# Patient Record
Sex: Male | Born: 1990 | Race: White | Hispanic: No | Marital: Single | Smoking: Never smoker
Health system: Southern US, Community
[De-identification: ages and names within clinical notes are randomized; demographics above are authoritative.]

## PROBLEM LIST (undated history)

## (undated) DIAGNOSIS — M6282 Rhabdomyolysis: Secondary | ICD-10-CM

## (undated) DIAGNOSIS — T7840XA Allergy, unspecified, initial encounter: Secondary | ICD-10-CM

## (undated) HISTORY — DX: Allergy, unspecified, initial encounter: T78.40XA

## (undated) HISTORY — DX: Rhabdomyolysis: M62.82

## (undated) HISTORY — PX: NO PAST SURGERIES: SHX2092

---

## 2014-06-07 ENCOUNTER — Inpatient Hospital Stay (HOSPITAL_COMMUNITY)
Admission: EM | Admit: 2014-06-07 | Discharge: 2014-06-10 | DRG: 558 | Disposition: A | Discharge status: Home / Self Care | Payer: 59 | Attending: Internal Medicine | Admitting: Internal Medicine

## 2014-06-07 ENCOUNTER — Encounter (HOSPITAL_COMMUNITY): Payer: Self-pay | Admitting: Cardiology

## 2014-06-07 ENCOUNTER — Emergency Department (HOSPITAL_COMMUNITY): Payer: 59

## 2014-06-07 DIAGNOSIS — Z888 Allergy status to other drugs, medicaments and biological substances status: Secondary | ICD-10-CM

## 2014-06-07 DIAGNOSIS — M791 Myalgia: Secondary | ICD-10-CM | POA: Diagnosis not present

## 2014-06-07 DIAGNOSIS — Q447 Other congenital malformations of liver: Secondary | ICD-10-CM

## 2014-06-07 DIAGNOSIS — M6282 Rhabdomyolysis: Principal | ICD-10-CM | POA: Diagnosis present

## 2014-06-07 DIAGNOSIS — T796XXA Traumatic ischemia of muscle, initial encounter: Secondary | ICD-10-CM

## 2014-06-07 HISTORY — DX: Rhabdomyolysis: M62.82

## 2014-06-07 LAB — BASIC METABOLIC PANEL
ANION GAP: 10 (ref 5–15)
BUN: 9 mg/dL (ref 6–23)
CALCIUM: 9.2 mg/dL (ref 8.4–10.5)
CO2: 25 mmol/L (ref 19–32)
Chloride: 105 mEq/L (ref 96–112)
Creatinine, Ser: 0.95 mg/dL (ref 0.50–1.35)
GFR calc Af Amer: >90 mL/min (ref >90)
GFR calc non Af Amer: >90 mL/min (ref >90)
GLUCOSE: 101 mg/dL — AB (ref 70–99)
Potassium: 4.6 mmol/L (ref 3.5–5.1)
Sodium: 140 mmol/L (ref 135–145)

## 2014-06-07 LAB — CBC WITH DIFFERENTIAL/PLATELET
BASOS PCT: 0 % (ref 0–1)
Basophils Absolute: 0 10*3/uL (ref 0.0–0.1)
EOS ABS: 0 10*3/uL (ref 0.0–0.7)
EOS PCT: 1 % (ref 0–5)
HCT: 43.7 % (ref 39.0–52.0)
Hemoglobin: 15.1 g/dL (ref 13.0–17.0)
Lymphocytes Relative: 12 % (ref 12–46)
Lymphs Abs: 1 10*3/uL (ref 0.7–4.0)
MCH: 30.6 pg (ref 26.0–34.0)
MCHC: 34.6 g/dL (ref 30.0–36.0)
MCV: 88.6 fL (ref 78.0–100.0)
Monocytes Absolute: 0.7 10*3/uL (ref 0.1–1.0)
Monocytes Relative: 8 % (ref 3–12)
NEUTROS PCT: 79 % — AB (ref 43–77)
Neutro Abs: 6.6 10*3/uL (ref 1.7–7.7)
Platelets: 296 10*3/uL (ref 150–400)
RBC: 4.93 MIL/uL (ref 4.22–5.81)
RDW: 12.1 % (ref 11.5–15.5)
WBC: 8.3 10*3/uL (ref 4.0–10.5)

## 2014-06-07 LAB — CK TOTAL AND CKMB (NOT AT ARMC)
CK, MB: 27.3 ng/mL — AB (ref 0.3–4.0)
Total CK: >50000 U/L — ABNORMAL HIGH (ref 7–232)

## 2014-06-07 LAB — URINALYSIS, ROUTINE W REFLEX MICROSCOPIC
GLUCOSE, UA: NEGATIVE mg/dL
Ketones, ur: 15 mg/dL — AB
Nitrite: NEGATIVE
Protein, ur: 100 mg/dL — AB
Specific Gravity, Urine: 1.027 (ref 1.005–1.030)
UROBILINOGEN UA: 0.2 mg/dL (ref 0.0–1.0)
pH: 5.5 (ref 5.0–8.0)

## 2014-06-07 LAB — URINE MICROSCOPIC-ADD ON

## 2014-06-07 LAB — TROPONIN I: Troponin I: <0.03 ng/mL (ref <0.031)

## 2014-06-07 MED ORDER — SODIUM CHLORIDE 0.9 % IV BOLUS (SEPSIS)
1000.0000 mL | Freq: Once | INTRAVENOUS | Status: AC
  Administered 2014-06-07: 1000 mL via INTRAVENOUS

## 2014-06-07 MED ORDER — OXYCODONE HCL 5 MG PO TABS: 5.0000 mg | ORAL_TABLET | ORAL | Status: DC | PRN

## 2014-06-07 MED ORDER — INFLUENZA VAC SPLIT QUAD 0.5 ML IM SUSY
0.5000 mL | PREFILLED_SYRINGE | INTRAMUSCULAR | Status: AC
  Administered 2014-06-08: 0.5 mL via INTRAMUSCULAR
  Filled 2014-06-07: qty 0.5

## 2014-06-07 MED ORDER — ENOXAPARIN SODIUM 40 MG/0.4ML ~~LOC~~ SOLN
40.0000 mg | SUBCUTANEOUS | Status: DC
  Administered 2014-06-07 – 2014-06-09 (×3): 40 mg via SUBCUTANEOUS
  Filled 2014-06-07 (×4): qty 0.4

## 2014-06-07 MED ORDER — SODIUM CHLORIDE 0.9 % IV SOLN
INTRAVENOUS | Status: DC
  Administered 2014-06-07 – 2014-06-08 (×3): via INTRAVENOUS
  Administered 2014-06-08 (×2): 300 mL via INTRAVENOUS
  Administered 2014-06-09 (×5): via INTRAVENOUS
  Administered 2014-06-09: 300 mL via INTRAVENOUS
  Administered 2014-06-10 (×3): via INTRAVENOUS

## 2014-06-07 NOTE — ED Provider Notes (Signed)
CSN: [id number]     Arrival date & time 06/07/14  1051 History   First MD Initiated Contact with Patient 06/07/14 1139     Chief Complaint  Patient presents with  . Dehydration  . Chest Pain     The history is provided by the patient.   Mr. Belia HemanHennessy presents for evaluation of right-sided chest pain. Four days ago he did a vigorous upper body workout with presses. He had typical soreness on his right chest wall the next day. His pain has become progressively worse to the point that he can barely move his chest and right arm. Pain is worse with range of motion. If he stays perfectly still he feels just tight. He denies fevers, shortness of breath, cough, leg swelling or pain. He has no medical problems and extremity medications. He reports his urine is dark, it looks like apple juice.  He was seen at urgent care today and referred to the ED for further eval.  Sxs are moderate, constant, worsening.    History reviewed. No pertinent past medical history. History reviewed. No pertinent past surgical history. History reviewed. No pertinent family history. History  Substance Use Topics  . Smoking status: Never Smoker   . Smokeless tobacco: Not on file  . Alcohol Use: Yes    Review of Systems  All other systems reviewed and are negative.     Allergies  Meningococcal vaccines  Home Medications   Prior to Admission medications   Not on File   BP 131/81 mmHg  Pulse 78  Temp(Src) 98 F (36.7 C) (Oral)  Resp 15  SpO2 95% Physical Exam  Constitutional: He is oriented to person, place, and time. He appears well-developed and well-nourished.  HENT:  Head: Normocephalic and atraumatic.  Cardiovascular: Normal rate and regular rhythm.   No murmur heard. Pulmonary/Chest: Effort normal and breath sounds normal. No respiratory distress. He exhibits tenderness.  Abdominal: Soft. There is no tenderness. There is no rebound and no guarding.  Musculoskeletal: He exhibits no edema or  tenderness.  Neurological: He is alert and oriented to person, place, and time.  Skin: Skin is warm and dry.  Psychiatric: He has a normal mood and affect. His behavior is normal.  Nursing note and vitals reviewed.   ED Course  Procedures (including critical care time) Labs Review Labs Reviewed  BASIC METABOLIC PANEL - Abnormal; Notable for the following:    Glucose, Bld 101 (*)    All other components within normal limits  CBC WITH DIFFERENTIAL - Abnormal; Notable for the following:    Neutrophils Relative % 79 (*)    All other components within normal limits  CK TOTAL AND CKMB - Abnormal; Notable for the following:    Total CK >50000 (*)    CK, MB 27.3 (*)    All other components within normal limits  URINALYSIS, ROUTINE W REFLEX MICROSCOPIC - Abnormal; Notable for the following:    Color, Urine AMBER (*)    APPearance CLOUDY (*)    Hgb urine dipstick LARGE (*)    Bilirubin Urine SMALL (*)    Ketones, ur 15 (*)    Protein, ur 100 (*)    Leukocytes, UA TRACE (*)    All other components within normal limits  TROPONIN I  URINE MICROSCOPIC-ADD ON    Imaging Review Dg Chest 2 View  06/07/2014   CLINICAL DATA:  Initial encounter for left upper chest pain and burning for 2 days. Nonsmoker.  EXAM: CHEST  2  VIEW  COMPARISON:  None.  FINDINGS: Midline trachea.  Normal heart size and mediastinal contours.  Sharp costophrenic angles.  No pneumothorax.  Clear lungs.  IMPRESSION: No active cardiopulmonary disease.   Electronically Signed   By: Jeronimo Greaves M.D.   On: 06/07/2014 13:31     EKG Interpretation   Date/Time:  Saturday June 07 2014 10:57:19 EST Ventricular Rate:  78 PR Interval:  130 QRS Duration: 86 QT Interval:  352 QTC Calculation: 401 R Axis:   93 Text Interpretation:  Normal sinus rhythm with sinus arrhythmia Rightward  axis biphasic t waves in V3-V6, TWI in III Abnormal ECG Confirmed by Lincoln Brigham [phone]) on 06/07/2014 11:42:27 AM      MDM   Final  diagnoses:  Traumatic rhabdomyolysis, initial encounter    Patient here with musculoskeletal chest pain following vigorous exercise, EKG with biphasic T waves. UA and CK are consistent with acute rhabdomyolysis, patient does not have any renal insufficiency or electrolyte abnormalities at this time. Discussed with patient need for admission and IV fluid hydration. Patient is in agreement. Discussed with internal medicine regarding admission for further treatment and management.    Tilden Fossa, MD 06/07/14 1537

## 2014-06-07 NOTE — H&P (Signed)
Date: 06/07/2014               Patient Name:  Harry Alvarado MRN: [id number]  DOB: 1990/08/27 Age / Sex: 24 y.o., male   PCP: No Pcp Per Patient         Medical Service: Internal Medicine Teaching Service         Attending Physician: Dr. Inez Catalina, MD    First Contact: Dr. Eleonore Chiquito Pager: [phone]  Second Contact: Dr. Lauris Chroman Pager: [phone]       After Hours (After 5p/  First Contact Pager: [phone]  weekends / holidays): Second Contact Pager: [phone]   Chief Complaint: muscle pain for 2 days and dark urine this morning  History of Present Illness: [name] is a 24 yo man with no medical history who presented to the ED today after he started to have diffuse muscle pain and dark urine. He started a new workout regimen four days ago that consisted of heavy lifting. He repeatedly bench-pressed 160 lbs on Tuesday, lifted to work his biceps and triceps on Wednesday and squatted 315 lbs on Thursday. He progressively felt muscle pain over the course of those three days, especially in his chest and abdomen, but attributed it to soreness from the exercises. He played basketball yesterday instead of lifting and had to quit early. When he urinated this morning, he noted his urine to be very dark red. He was in so much pain that he felt it hard to stand up from bed or lift his arm, so he came into the ED. He denies recent illness, trauma, dysuria, chest pressure or shortness of breath and has no recent use of alcohol, drugs or diet supplements. He has no family history of diabetes or thyroid disease and does not take statins or any other medications.  Meds: No current facility-administered medications for this encounter.    Allergies: Allergies as of 06/07/2014 - Review Complete 06/07/2014  Allergen Reaction Noted  . Meningococcal vaccines  06/07/2014   History reviewed. No pertinent past medical history. History reviewed. No pertinent past surgical  history. History reviewed. No pertinent family history. History   Social History  . Marital Status: Single    Spouse Name: N/A    Number of Children: N/A  . Years of Education: N/A   Occupational History  . In his second semester of business school at [employer]   Family History: s/f skin cancer. No diabetes, thyroid disease  Social History Main Topics  . Smoking status: Never Smoker   . Smokeless tobacco: Not on file  . Alcohol Use: Yes  . Drug Use: No  . Sexual Activity: Not on file  Moved here from [place] last summer with his girlfriend Other Topics Concern  . Not on file   Social History Narrative  . No narrative on file    Review of Systems: General: no recent illness, has been feeling well Skin: no rashes or lesions HEENT: no cough, no sore throat Cardiac: bilateral chest pain that is achey Respiratory: no shortness of breath GI: abdominal pain that is achey Urinary: discoloration, but no dysuria Msk: diffuse muscle soreness Endocrine: no diaphoresis, no shakiness, does not feel hot or cold when others do not Psychiatric: no history of depression or anxiety  Physical Exam: Blood pressure 134/74, pulse 81, temperature 98 F (36.7 C), temperature source Oral, resp. rate 16, SpO2 97 %. Appearance: in NAD, lying in bed in ED with girlfriend at bedside HEENT: AT/[place], PERRL, EOMi,  no lymphadenopathy Heart: RRR, normal S1S2, no MRG, tender to palpation diffusely across chest Lungs: CTAB, no wheezes Abdomen: soft, BS+, slightly tender to palpation diffusely, normal color Musculoskeletal: tender to palpation in multiple muscle groups, movements in bed painful during exam (sitting up) Extremities: no evidence of trauma, no edema Neurologic: A&Ox3, grossly intact Skin: no lesions or rashes  Lab results: Basic Metabolic Panel:  Recent Labs  16/02/9600/16/16 1210  NA 140  K 4.6  CL 105  CO2 25  GLUCOSE 101*  BUN 9  CREATININE 0.95  CALCIUM 9.2   CBC:  Recent Labs   06/07/14 1210  WBC 8.3  NEUTROABS 6.6  HGB 15.1  HCT 43.7  MCV 88.6  PLT 296   Cardiac Enzymes:  Recent Labs  06/07/14 1210  CKTOTAL >50000*  CKMB 27.3*  TROPONINI <0.03   Urinalysis:  Recent Labs  06/07/14 1334  COLORURINE AMBER*  LABSPEC 1.027  PHURINE 5.5  GLUCOSEU NEGATIVE  HGBUR LARGE*  BILIRUBINUR SMALL*  KETONESUR 15*  PROTEINUR 100*  UROBILINOGEN 0.2  NITRITE NEGATIVE  LEUKOCYTESUR TRACE*    Imaging results:  Dg Chest 2 View  06/07/2014   CLINICAL DATA:  Initial encounter for left upper chest pain and burning for 2 days. Nonsmoker.  EXAM: CHEST  2 VIEW  COMPARISON:  None.  FINDINGS: Midline trachea.  Normal heart size and mediastinal contours.  Sharp costophrenic angles.  No pneumothorax.  Clear lungs.  IMPRESSION: No active cardiopulmonary disease.   Electronically Signed   By: Jeronimo GreavesKyle  Talbot M.D.   On: 06/07/2014 13:31    Other results: EKG: Rate 78, normal sinus rhythm, right axis, biphasic t waves in V3-V6 with T-wave inversions in III  Assessment & Plan by Problem: Active Problems:   Rhabdomyolysis  [name] is a 24 yo man with no medical history who presented with a CK of 50,000, CKMB of 27.3 following vigorous weight lifting over the past 4 days. His electrolytes are normal and his urine is dark red. He has rhabdomyolysis. His EKG also showed some T-wave inversions and biphasic t waves that could indicate ischemia or could be normal for a young, healthy man. He denies chest pressure, shortness of breath, cardiac family or personal history and chest pain other than the achey, muscular pain he feels in his entire chest and abdomen. However, there remains concern for MI, as CK-MB is generally indicative of cardiac, rather than muscle damage. He has already felt lessening of his body pains with hydration in the ED.  Rhabdomyolysis: 2 boluses completed in ED. - IV NS 150 ml/hr - Oxy IR for pain  - CBC and CMP in am - UA - Bed rest  T-Wave  Inversions and Biphasic T Waves on EKG: First troponin negative. CKMB elevated. - Repeat EKG in am - Trending troponins x3  Diet: - Regular  DVT Ppx:  - Lovenox 40 mg [place]  Dispo: Disposition is deferred at this time, awaiting improvement of current medical problems. Anticipated discharge in approximately 1-2 day(s).   The patient does not have a current PCP (No Pcp Per Patient) and [facility] after discharge.  The patient does not have transportation limitations that hinder transportation to clinic appointments.  Signed: Dionne AnoJulia Nikayla Madaris, MD 06/07/2014, 4:48 PM

## 2014-06-07 NOTE — ED Notes (Signed)
Admitting at bedside 

## 2014-06-07 NOTE — ED Notes (Signed)
Pt reports that he was sent from [facility] for a further work up. Pt reports that he was been working harder than he normally does. Reports that he has also been having som chest pain. Pt reports that his urine has been dark in color.

## 2014-06-08 LAB — COMPREHENSIVE METABOLIC PANEL
ALBUMIN: 3.9 g/dL (ref 3.5–5.2)
ALT: 306 U/L — ABNORMAL HIGH (ref 0–53)
ALT: 356 U/L — ABNORMAL HIGH (ref 0–53)
ANION GAP: 4 — AB (ref 5–15)
AST: 881 U/L — ABNORMAL HIGH (ref 0–37)
AST: 1017 U/L — AB (ref 0–37)
Albumin: 3.8 g/dL (ref 3.5–5.2)
Alkaline Phosphatase: 55 U/L (ref 39–117)
Alkaline Phosphatase: 57 U/L (ref 39–117)
Anion gap: 9 (ref 5–15)
BILIRUBIN TOTAL: 1 mg/dL (ref 0.3–1.2)
BUN: 5 mg/dL — ABNORMAL LOW (ref 6–23)
BUN: 5 mg/dL — AB (ref 6–23)
CALCIUM: 8.9 mg/dL (ref 8.4–10.5)
CHLORIDE: 107 meq/L (ref 96–112)
CO2: 28 mmol/L (ref 19–32)
CO2: 24 mmol/L (ref 19–32)
CREATININE: 0.92 mg/dL (ref 0.50–1.35)
Calcium: 8.9 mg/dL (ref 8.4–10.5)
Chloride: 108 mEq/L (ref 96–112)
Creatinine, Ser: 0.91 mg/dL (ref 0.50–1.35)
GFR calc Af Amer: >90 mL/min (ref >90)
GFR calc Af Amer: >90 mL/min (ref >90)
GFR calc non Af Amer: >90 mL/min (ref >90)
GLUCOSE: 128 mg/dL — AB (ref 70–99)
Glucose, Bld: 104 mg/dL — ABNORMAL HIGH (ref 70–99)
Potassium: 4 mmol/L (ref 3.5–5.1)
Potassium: 3.6 mmol/L (ref 3.5–5.1)
SODIUM: 140 mmol/L (ref 135–145)
Sodium: 140 mmol/L (ref 135–145)
TOTAL PROTEIN: 6.4 g/dL (ref 6.0–8.3)
Total Bilirubin: 1 mg/dL (ref 0.3–1.2)
Total Protein: 6.6 g/dL (ref 6.0–8.3)

## 2014-06-08 LAB — URINALYSIS, ROUTINE W REFLEX MICROSCOPIC
Bilirubin Urine: NEGATIVE
Glucose, UA: NEGATIVE mg/dL
Ketones, ur: NEGATIVE mg/dL
Leukocytes, UA: NEGATIVE
Nitrite: NEGATIVE
Protein, ur: NEGATIVE mg/dL
SPECIFIC GRAVITY, URINE: 1.013 (ref 1.005–1.030)
Urobilinogen, UA: 0.2 mg/dL (ref 0.0–1.0)
pH: 6 (ref 5.0–8.0)

## 2014-06-08 LAB — URINE MICROSCOPIC-ADD ON

## 2014-06-08 LAB — CBC
HEMATOCRIT: 41.8 % (ref 39.0–52.0)
HEMOGLOBIN: 14.5 g/dL (ref 13.0–17.0)
MCH: 30.7 pg (ref 26.0–34.0)
MCHC: 34.7 g/dL (ref 30.0–36.0)
MCV: 88.4 fL (ref 78.0–100.0)
Platelets: 267 10*3/uL (ref 150–400)
RBC: 4.73 MIL/uL (ref 4.22–5.81)
RDW: 12.2 % (ref 11.5–15.5)
WBC: 6.9 10*3/uL (ref 4.0–10.5)

## 2014-06-08 LAB — TROPONIN I

## 2014-06-08 LAB — CK

## 2014-06-08 NOTE — Plan of Care (Signed)
Problem: Phase I Progression Outcomes Goal: Pain controlled with appropriate interventions Outcome: Completed/Met Date Met:  06/08/14 No pain meds given on 7A-7P shift.

## 2014-06-08 NOTE — Plan of Care (Signed)
Problem: Phase I Progression Outcomes Goal: OOB as tolerated unless otherwise ordered Outcome: Completed/Met Date Met:  06/08/14 Up in room

## 2014-06-08 NOTE — Progress Notes (Signed)
UR Completed.  [phone]  

## 2014-06-08 NOTE — Progress Notes (Signed)
Subjective: [name] feels "so much better!" today. He reports nearly completely resolved pain and pale yellow urine.  Objective: Vital signs in last 24 hours: Filed Vitals:   06/07/14 1600 06/07/14 1657 06/07/14 2115 06/08/14 0434  BP: 134/74 135/91 134/69 134/74  Pulse: 81 86 79 69  Temp:  98.2 F (36.8 C) 98.6 F (37 C) 97.9 F (36.6 C)  TempSrc:  Oral Oral Oral  Resp:  16 17 18   Height:  5' 10.75" (1.797 m)    Weight:  199 lb 14.4 oz (90.674 kg)    SpO2: 97% 99% 99% 98%   Weight change:   Intake/Output Summary (Last 24 hours) at 06/08/14 0914 Last data filed at 06/08/14 0845  Gross per 24 hour  Intake    750 ml  Output   3950 ml  Net  -3200 ml   Physical Exam: Appearance: in NAD, comfortably lying in bed watching TV. Pale yellow urine in bathroom HEENT: AT/Harry Alvarado, PERRL, EOMi, no lymphadenopathy Heart: RRR, normal S1S2, no MRG, very mildly tender to palpation diffusely across chest Lungs: CTAB, no wheezes Abdomen: soft, BS+, slightly tender to palpation diffusely, normal color Musculoskeletal: movements no longer painful Extremities: no evidence of trauma, no edema Neurologic: A&Ox3, grossly intact Skin: no lesions or rashes  Lab Results: Basic Metabolic Panel:  Recent Labs Lab 06/07/14 1210 06/08/14 0410  NA 140 140  K 4.6 4.0  CL 105 108  CO2 25 28  GLUCOSE 101* 104*  BUN 9 5*  CREATININE 0.95 0.92  CALCIUM 9.2 8.9   Liver Function Tests:  Recent Labs Lab 06/08/14 0410  AST 881*  ALT 306*  ALKPHOS 55  BILITOT 1.0  PROT 6.4  ALBUMIN 3.8   CBC:  Recent Labs Lab 06/07/14 1210 06/08/14 0410  WBC 8.3 6.9  NEUTROABS 6.6  --   HGB 15.1 14.5  HCT 43.7 41.8  MCV 88.6 88.4  PLT 296 267   Cardiac Enzymes:  Recent Labs Lab 06/07/14 1210 06/07/14 1845 06/08/14 0025  CKTOTAL >50000*  --   --   CKMB 27.3*  --   --   TROPONINI <0.03 <0.03 <0.03   Urinalysis:  Recent Labs Lab 06/07/14 1334 06/08/14 0444  COLORURINE AMBER*  YELLOW  LABSPEC 1.027 1.013  PHURINE 5.5 6.0  GLUCOSEU NEGATIVE NEGATIVE  HGBUR LARGE* LARGE*  BILIRUBINUR SMALL* NEGATIVE  KETONESUR 15* NEGATIVE  PROTEINUR 100* NEGATIVE  UROBILINOGEN 0.2 0.2  NITRITE NEGATIVE NEGATIVE  LEUKOCYTESUR TRACE* NEGATIVE   Studies/Results: Dg Chest 2 View  06/07/2014   CLINICAL DATA:  Initial encounter for left upper chest pain and burning for 2 days. Nonsmoker.  EXAM: CHEST  2 VIEW  COMPARISON:  None.  FINDINGS: Midline trachea.  Normal heart size and mediastinal contours.  Sharp costophrenic angles.  No pneumothorax.  Clear lungs.  IMPRESSION: No active cardiopulmonary disease.   Electronically Signed   By: Jeronimo GreavesKyle  Talbot M.D.   On: 06/07/2014 13:31   Medications: I have reviewed the patient's current medications. Scheduled Meds: . enoxaparin (LOVENOX) injection  40 mg Subcutaneous Q24H  . Influenza vac split quadrivalent PF  0.5 mL Intramuscular Tomorrow-1000   Continuous Infusions: . sodium chloride 250 mL/hr at 06/08/14 0908   PRN Meds:.oxyCODONE Assessment/Plan: Active Problems:   Rhabdomyolysis  [name] is a 24 yo man with no medical history who presented with a CK of 50,000, CKMB of 27.3 and elevated LFTs (AST>ALT) following vigorous weight lifting over the past 4 days. His electrolytes are normal and  his urine is dark red. He has rhabdomyolysis. His EKG also showed some T-wave inversions and biphasic t waves that could indicate ischemia or could be normal for a young, healthy man. He denies chest pressure, shortness of breath, cardiac family or personal history and chest pain other than the achey, muscular pain he feels in his entire chest and abdomen. However, there was initial concern for MI, as CK-MB is generally indicative of cardiac, rather than muscle damage. His repeat EKG and troponins were normal, however, and he feels much better; he also has had normal colored urine today.  Rhabdomyolysis: 2 boluses completed in ED. Transaminases  (especially AST) are present in muscle tissues, and therefore elevated LFTs are associated with rhabdomyolysis in the absence of liver injury. It is most common to see elevated AST (occurs in 93% of rhabdomyolysis patients; elevated ALT present in 75%), as aspartate aminotransferase is expected to be released from damaged muscle. The AST is expected to resolve in parallel with the decreasing CK. UA showed some normalization, but was still positive for large Hgb. - Repeat CK pending - Increase IV NS rate to 250 ml/hr  - Oxy IR for pain  - Bed rest  T-Wave Inversions and Biphasic T Waves on EKG: CKMB elevated, but troponins and repeat EKG negative. Likely no cardiac involvement in this young, healthy man.  Diet: - Regular  DVT Ppx:  - Lovenox 40 mg [place]  Dispo: Disposition is deferred at this time, awaiting improvement of current medical problems.  Anticipated discharge in approximately 1 day(s).   The patient does not have a current PCP (No Pcp Per Patient) and does need an [facility] hospital follow-up appointment after discharge.  The patient does not have transportation limitations that hinder transportation to clinic appointments.  .Services Needed at time of discharge: Y = Yes, Blank = No PT:   OT:   RN:   Equipment:   Other:     LOS: 1 day   Dionne Ano, MD 06/08/2014, 9:14 AM

## 2014-06-09 DIAGNOSIS — M6282 Rhabdomyolysis: Principal | ICD-10-CM

## 2014-06-09 DIAGNOSIS — R9431 Abnormal electrocardiogram [ECG] [EKG]: Secondary | ICD-10-CM

## 2014-06-09 LAB — CK: Total CK: >50000 U/L — ABNORMAL HIGH (ref 7–232)

## 2014-06-09 LAB — COMPREHENSIVE METABOLIC PANEL
ALK PHOS: 48 U/L (ref 39–117)
ALT: 326 U/L — AB (ref 0–53)
AST: 797 U/L — ABNORMAL HIGH (ref 0–37)
Albumin: 3.2 g/dL — ABNORMAL LOW (ref 3.5–5.2)
Anion gap: 6 (ref 5–15)
BUN: 5 mg/dL — ABNORMAL LOW (ref 6–23)
CO2: 25 mmol/L (ref 19–32)
Calcium: 8.6 mg/dL (ref 8.4–10.5)
Chloride: 110 mEq/L (ref 96–112)
Creatinine, Ser: 0.83 mg/dL (ref 0.50–1.35)
GFR calc Af Amer: >90 mL/min (ref >90)
GFR calc non Af Amer: >90 mL/min (ref >90)
Glucose, Bld: 101 mg/dL — ABNORMAL HIGH (ref 70–99)
Potassium: 4.2 mmol/L (ref 3.5–5.1)
SODIUM: 141 mmol/L (ref 135–145)
Total Bilirubin: 0.6 mg/dL (ref 0.3–1.2)
Total Protein: 5.9 g/dL — ABNORMAL LOW (ref 6.0–8.3)

## 2014-06-09 LAB — HIV ANTIBODY (ROUTINE TESTING W REFLEX)
HIV 1/O/2 Abs-Index Value: <1 (ref <1.00)
HIV-1/HIV-2 Ab: NONREACTIVE

## 2014-06-09 NOTE — Progress Notes (Signed)
Subjective: [name] feels great today. He reports nearly completely resolved pain and continued pale yellow urine. He is eager to get home, but understands that he may need to stay since his CK is still high. Has good questions about why these levels are worrisome.  He has not been using his pain medications.  Objective: Vital signs in last 24 hours: Filed Vitals:   06/08/14 0434 06/08/14 1318 06/08/14 2300 06/09/14 0525  BP: 134/74 136/82 129/73 127/70  Pulse: 69 72 61 20  Temp: 97.9 F (36.6 C) 99.2 F (37.3 C) 98.1 F (36.7 C) 97.6 F (36.4 C)  TempSrc: Oral Oral Oral Oral  Resp: 18 17 18 17   Height:      Weight:      SpO2: 98% 99% 100% 100%   Weight change:   Intake/Output Summary (Last 24 hours) at 06/09/14 1430 Last data filed at 06/09/14 0400  Gross per 24 hour  Intake 8395.83 ml  Output   1800 ml  Net 6595.83 ml   Physical Exam: Appearance: in NAD, comfortably lying in bed awaiting breakfast without a shirt on. Pale yellow urine in bathroom.  HEENT: AT/[place], PERRL, EOMi, no lymphadenopathy Heart: RRR, normal S1S2, no MRG, very mildly tender to palpation diffusely across chest, no pain on movement Lungs: CTAB, no wheezes Abdomen: soft, BS+, slightly tender to palpation diffusely, normal color Musculoskeletal: movements no longer painful Extremities: no evidence of trauma, no edema Neurologic: A&Ox3, grossly intact Skin: no lesions or rashes  Lab Results: Basic Metabolic Panel:  Recent Labs Lab 06/08/14 1410 06/09/14 0452  NA 140 141  K 3.6 4.2  CL 107 110  CO2 24 25  GLUCOSE 128* 101*  BUN 5* 5*  CREATININE 0.91 0.83  CALCIUM 8.9 8.6   Liver Function Tests:  Recent Labs Lab 06/08/14 1410 06/09/14 0452  AST 1017* 797*  ALT 356* 326*  ALKPHOS 57 48  BILITOT 1.0 0.6  PROT 6.6 5.9*  ALBUMIN 3.9 3.2*   CBC:  Recent Labs Lab 06/07/14 1210 06/08/14 0410  WBC 8.3 6.9  NEUTROABS 6.6  --   HGB 15.1 14.5  HCT 43.7 41.8  MCV 88.6 88.4   PLT 296 267   Cardiac Enzymes:  Recent Labs Lab 06/07/14 1210 06/07/14 1845 06/08/14 0025 06/08/14 0925 06/09/14 0452  CKTOTAL >50000*  --   --  >50000* >50000*  CKMB 27.3*  --   --   --   --   TROPONINI <0.03 <0.03 <0.03  --   --    Urinalysis:  Recent Labs Lab 06/07/14 1334 06/08/14 0444  COLORURINE AMBER* YELLOW  LABSPEC 1.027 1.013  PHURINE 5.5 6.0  GLUCOSEU NEGATIVE NEGATIVE  HGBUR LARGE* LARGE*  BILIRUBINUR SMALL* NEGATIVE  KETONESUR 15* NEGATIVE  PROTEINUR 100* NEGATIVE  UROBILINOGEN 0.2 0.2  NITRITE NEGATIVE NEGATIVE  LEUKOCYTESUR TRACE* NEGATIVE   Studies/Results: No results found. Medications: I have reviewed the patient's current medications. Scheduled Meds: . enoxaparin (LOVENOX) injection  40 mg Subcutaneous Q24H   Continuous Infusions: . sodium chloride 300 mL/hr at 06/09/14 1252   PRN Meds:.oxyCODONE Assessment/Plan: Active Problems:   Rhabdomyolysis  [name] is a 24 yo man with no medical history who presented on 1/16 with a CK of 50,000, CKMB of 27.3 and elevated LFTs (AST>ALT) following vigorous weight lifting over the past 4 days. His electrolytes were normal and his urine was dark red. He has rhabdomyolysis. His initial EKG also showed some T-wave inversions and biphasic t waves that were concerning  for ischemia or could be normal for a young, healthy man. He denied chest pressure, shortness of breath, cardiac family or personal history and chest pain other than the achey, muscular pain he felt in his entire chest and abdomen. However, there was initial concern for MI, as CK-MB is generally indicative of cardiac, rather than muscle damage. His repeat EKG and troponins were normal, however, and he feels much better; he also has had normal colored urine starting on 1/17. His CK continues to be >50,000, so he is being kept for observation until these levels show down-trending.  Rhabdomyolysis: 2 boluses completed in ED. Transaminases  (especially AST) are present in muscle tissues, and therefore elevated LFTs are associated with rhabdomyolysis in the absence of liver injury. It is most common to see elevated AST (occurs in 93% of rhabdomyolysis patients; elevated ALT present in 75%), as aspartate aminotransferase is expected to be released from damaged muscle. The AST is expected to resolve in parallel with the decreasing CK. AST is downtrending (1017-->797) which is suggestive of improvement in the CK. However, until the CK is under 50,000 and shows definite signs of regression, he will be kept here for observation. UA showed some normalization, but was still positive for large Hgb. - Repeat CK tomorrow am - Repeat LFTs tomorrow am - Increased IV NS rate to 300 ml/hr  - Oxy IR for pain (patient is not using this) - Removed bed rest order  T-Wave Inversions and Biphasic T Waves on EKG: CKMB elevated, but troponins and repeat EKG negative. Likely no cardiac involvement in this young, healthy man. No more chest pain.  Diet: - Regular  DVT Ppx:  - Lovenox 40 mg [place]  Dispo: Disposition is deferred at this time, awaiting improvement of current medical problems.  Anticipated discharge in approximately 1 day(s).   The patient does not have a current PCP (No Pcp Per Patient) and does need an [facility] hospital follow-up appointment after discharge.  The patient does not have transportation limitations that hinder transportation to clinic appointments.  .Services Needed at time of discharge: Y = Yes, Blank = No PT:   OT:   RN:   Equipment:   Other:     LOS: 2 days   Dionne Ano, MD 06/09/2014, 2:30 PM

## 2014-06-10 LAB — COMPREHENSIVE METABOLIC PANEL
ALT: 338 U/L — ABNORMAL HIGH (ref 0–53)
AST: 695 U/L — ABNORMAL HIGH (ref 0–37)
Albumin: 3.6 g/dL (ref 3.5–5.2)
Alkaline Phosphatase: 49 U/L (ref 39–117)
Anion gap: 10 (ref 5–15)
BILIRUBIN TOTAL: 0.7 mg/dL (ref 0.3–1.2)
BUN: 5 mg/dL — AB (ref 6–23)
CHLORIDE: 107 meq/L (ref 96–112)
CO2: 24 mmol/L (ref 19–32)
Calcium: 9 mg/dL (ref 8.4–10.5)
Creatinine, Ser: 0.87 mg/dL (ref 0.50–1.35)
GLUCOSE: 87 mg/dL (ref 70–99)
POTASSIUM: 3.9 mmol/L (ref 3.5–5.1)
Sodium: 141 mmol/L (ref 135–145)
TOTAL PROTEIN: 6.4 g/dL (ref 6.0–8.3)

## 2014-06-10 LAB — CK
CK TOTAL: 34164 U/L — AB (ref 7–232)
Total CK: 35439 U/L — ABNORMAL HIGH (ref 7–232)

## 2014-06-10 MED ORDER — SODIUM CHLORIDE 0.9 % IV BOLUS (SEPSIS)
1000.0000 mL | Freq: Once | INTRAVENOUS | Status: AC
  Administered 2014-06-10: 1000 mL via INTRAVENOUS

## 2014-06-10 NOTE — Progress Notes (Signed)
Subjective: [name] feels great today. He reports complete resolution of his pain and continued pale yellow urine. He is eager to get home and verbalizes understanding that he will need to drink large amounts of Gatorade and water at discharge and will need close follow up in our clinic.   He has continued to require none of his pain medications.  Objective: Vital signs in last 24 hours: Filed Vitals:   06/09/14 0525 06/09/14 1345 06/09/14 2136 06/10/14 0624  BP: 127/70 126/78 127/74 115/62  Pulse: 20 72 68 66  Temp: 97.6 F (36.4 C) 98.6 F (37 C) 98.5 F (36.9 C) 98.5 F (36.9 C)  TempSrc: Oral Oral Oral Oral  Resp: 17 18 17 18   Height:      Weight:      SpO2: 100% 100% 99% 99%   Weight change:   Intake/Output Summary (Last 24 hours) at 06/10/14 1314 Last data filed at 06/10/14 0626  Gross per 24 hour  Intake   5860 ml  Output      0 ml  Net   5860 ml   Physical Exam: Appearance: in NAD, standing in room, moving with no distress HEENT: AT/[place], PERRL, EOMi, no lymphadenopathy Heart: RRR, normal S1S2, no MRG, no tenderness to palpation across chest, no pain on movement Lungs: CTAB, no wheezes Abdomen: soft, BS+, slightly tender to palpation diffusely, normal color Musculoskeletal: movements no longer painful Extremities: no evidence of trauma, no edema Neurologic: A&Ox3, grossly intact Skin: no lesions or rashes  Lab Results: Basic Metabolic Panel:  Recent Labs Lab 06/09/14 0452 06/10/14 0402  NA 141 141  K 4.2 3.9  CL 110 107  CO2 25 24  GLUCOSE 101* 87  BUN 5* 5*  CREATININE 0.83 0.87  CALCIUM 8.6 9.0   Liver Function Tests:  Recent Labs Lab 06/09/14 0452 06/10/14 0402  AST 797* 695*  ALT 326* 338*  ALKPHOS 48 49  BILITOT 0.6 0.7  PROT 5.9* 6.4  ALBUMIN 3.2* 3.6   CBC:  Recent Labs Lab 06/07/14 1210 06/08/14 0410  WBC 8.3 6.9  NEUTROABS 6.6  --   HGB 15.1 14.5  HCT 43.7 41.8  MCV 88.6 88.4  PLT 296 267   Cardiac  Enzymes:  Recent Labs Lab 06/07/14 1210 06/07/14 1845 06/08/14 0025 06/08/14 0925 06/09/14 0452 06/10/14 0402  CKTOTAL >50000*  --   --  >50000* >50000* 35439*  CKMB 27.3*  --   --   --   --   --   TROPONINI <0.03 <0.03 <0.03  --   --   --    Urinalysis:  Recent Labs Lab 06/07/14 1334 06/08/14 0444  COLORURINE AMBER* YELLOW  LABSPEC 1.027 1.013  PHURINE 5.5 6.0  GLUCOSEU NEGATIVE NEGATIVE  HGBUR LARGE* LARGE*  BILIRUBINUR SMALL* NEGATIVE  KETONESUR 15* NEGATIVE  PROTEINUR 100* NEGATIVE  UROBILINOGEN 0.2 0.2  NITRITE NEGATIVE NEGATIVE  LEUKOCYTESUR TRACE* NEGATIVE   Studies/Results: No results found. Medications: I have reviewed the patient's current medications. Scheduled Meds: . enoxaparin (LOVENOX) injection  40 mg Subcutaneous Q24H   Continuous Infusions: . sodium chloride 300 mL/hr at 06/10/14 1241   PRN Meds:.oxyCODONE Assessment/Plan: Active Problems:   Rhabdomyolysis  [name] is a 24 yo man with no medical history who presented on 1/16 with a CK of 50,000, CKMB of 27.3 and elevated LFTs (AST>ALT) following vigorous weight lifting over the past 4 days. His electrolytes were normal and his urine was dark red. He has rhabdomyolysis. His initial EKG  also showed some T-wave inversions and biphasic t waves that were concerning for ischemia or could be normal for a young, healthy man. He denied chest pressure, shortness of breath, cardiac family or personal history and chest pain other than the achey, muscular pain he felt in his entire chest and abdomen. However, there was initial concern for MI, as CK-MB is generally indicative of cardiac, rather than muscle damage. His repeat EKG and troponins were normal, however, and he feels much better; he also has had normal colored urine starting on 1/17. His CK level was difficult to trend as his first readings were not quantitated beyond ">50,000"; for the first time this morning, his CK was <50,000 at 35,439. The patient  continues to improve clinically.  Rhabdomyolysis: 2 boluses completed in ED. Transaminases (especially AST) are present in muscle tissues, and therefore elevated LFTs are associated with rhabdomyolysis in the absence of liver injury. It is most common to see elevated AST (occurs in 93% of rhabdomyolysis patients; elevated ALT present in 75%), as aspartate aminotransferase is expected to be released from damaged muscle. CK finally showed downtrend for the first time today (>50,000 --> 35,439). The AST is expected to resolve in parallel with the decreasing CK and is downtrending, consistent with the downtrending CK: 1017-->797-->695). He will be kept here for observation and continuing aggressive fluids during the day today, then sent home provided that his afternoon CK shows continued downtrend.  - Repeat CK this afternoon; if continuing to downtrend, will d/c home - IV NS rate to 300 ml/hr and added 1 bolus prior to discharge - Oxy IR for pain (patient has not been using this) - Encouraging OOB  T-Wave Inversions and Biphasic T Waves on EKG: CKMB elevated, but troponins and repeat EKG negative. Likely no cardiac involvement in this young, healthy man. No more chest pain.  Diet: - Regular  DVT Ppx:  - Lovenox 40 mg [place]  Dispo: Disposition is deferred at this time, awaiting improvement of current medical problems.  Anticipated discharge in approximately 0 day(s).   The patient does not have a current PCP (No Pcp Per Patient) and does need an [facility] hospital follow-up appointment after discharge.  The patient does not have transportation limitations that hinder transportation to clinic appointments.  .Services Needed at time of discharge: Y = Yes, Blank = No PT:   OT:   RN:   Equipment:   Other:     LOS: 3 days   Dionne Ano, MD 06/10/2014, 1:14 PM

## 2014-06-10 NOTE — Progress Notes (Signed)
Patient discharged to home with instructions. 

## 2014-06-10 NOTE — Discharge Summary (Signed)
Name: Harry Alvarado MRN: [id number] DOB: 06/25/1990 24 y.o. PCP: No Pcp Per Patient  Date of Admission: 06/07/2014 11:07 AM Date of Discharge: 06/10/2014 Attending Physician: Earl Lagos, MD  Discharge Diagnosis: Active Problems:   Rhabdomyolysis  Discharge Medications:   Medication List    Notice    You have not been prescribed any medications.      Disposition and follow-up:   [name] was discharged from [facility] in Stable condition.  At the hospital follow up visit please address:  1.  Rhabdomyolysis: patient discharged with still extremely high CK - please follow a repeat CK on Thursday to make sure it is downtrending. AST should also downtrend in parallel.   2.  Labs / imaging needed at time of follow-up: CK and LFTs  3.  Pending labs/ test needing follow-up: none  Follow-up Appointments:     Follow-up Information    Follow up with Dionne Ano, MD On 06/12/2014.   Specialty:  Internal Medicine   Why:  1:15 PM   Contact information:   [address] [place] [address] [phone]       Discharge Instructions: Drink A LOT of gatorade and water (more gatorade than water) and do NO physical activity for 1 week. That means no work, no school, no working out, no lifting, no sex. It is extremely important that you take it easy while you recover from this very serious episode!  Rhabdomyolysis Rhabdomyolysis is the breakdown of muscle fibers due to injury. The injury may come from physical damage to the muscle like an injury but other causes are:  High fever (hyperthermia).  Seizures (convulsions).  Low phosphate levels.  Diseases of metabolism.  Heatstroke.  Drug toxicity.  Over exertion.  Alcoholism.  Muscle is cut off from oxygen (anoxia).  The squeezing of nerves and blood vessels (compartment syndrome). Some drugs which may cause the breakdown of muscle  are:  Antibiotics.  Statins.  Alcohol.  Animal toxins. Myoglobin is a substance which helps muscle use oxygen. When the muscle is damaged, the myoglobin is released into the bloodstream. It is filtered out of the bloodstream by the kidneys. Myoglobin may block up the kidneys. This may cause damage, such as kidney failure. It also breaks down into other damaging toxic parts, which also cause kidney failure.  SYMPTOMS   Dark, red, or tea colored urine.  Weakness of affected muscles.  Weight gain from water retention.  Joint aches and pains.  Irregular heart from high potassium in the blood.  Muscle tenderness or aching.  Generalized weakness.  Seizures.  Feeling tired (fatigue). DIAGNOSIS  Your caregiver may find muscle tenderness on exam and suspect the problem. Urine tests and blood work can confirm the problem. TREATMENT   Early and aggressive treatment with large amounts of fluids may help prevent kidney failure.  Water producing medicine (diuretic) may be used to help flush the kidneys.  High potassium and calcium problems (electrolyte) in your blood may need treatment. HOME CARE INSTRUCTIONS  This problem is usually cared for in a hospital. If you are allowed to go home and require dialysis, make sure you keep all appointments for lab work and dialysis. Not doing so could result in death. Document Released: 05/08/2004 Document Revised: 08/01/2011 Document Reviewed: 11/03/2008 [facility] Patient Information 2015 [place], [place]. This information is not intended to replace advice given to you by your health care provider. Make sure you discuss any questions you have with your health care provider.  Consultations:  none  Procedures Performed:  Dg Chest 2 View  06/07/2014   CLINICAL DATA:  Initial encounter for left upper chest pain and burning for 2 days. Nonsmoker.  EXAM: CHEST  2 VIEW  COMPARISON:  None.  FINDINGS: Midline trachea.  Normal heart size and  mediastinal contours.  Sharp costophrenic angles.  No pneumothorax.  Clear lungs.  IMPRESSION: No active cardiopulmonary disease.   Electronically Signed   By: Jeronimo Greaves M.D.   On: 06/07/2014 13:31    Admission HPI: [name] is a 24 yo man with no medical history who presented to the ED today after he started to have diffuse muscle pain and dark urine. He started a new workout regimen four days ago that consisted of heavy lifting. He repeatedly bench-pressed 160 lbs on Tuesday, lifted to work his biceps and triceps on Wednesday and squatted 315 lbs on Thursday. He progressively felt muscle pain over the course of those three days, especially in his chest and abdomen, but attributed it to soreness from the exercises. He played basketball yesterday instead of lifting and had to quit early. When he urinated this morning, he noted his urine to be very dark red. He was in so much pain that he felt it hard to stand up from bed or lift his arm, so he came into the ED. He denies recent illness, trauma, dysuria, chest pressure or shortness of breath and has no recent use of alcohol, drugs or diet supplements. He has no family history of diabetes or thyroid disease and does not take statins or any other medications.  Hospital Course by problem list: Active Problems:   Rhabdomyolysis   [name] is a 24 yo man with no medical history who presented on 1/16 with a CK of 50,000, CKMB of 27.3 and elevated LFTs (AST>ALT) following vigorous weight lifting over the past 4 days. His electrolytes were normal and his urine was dark red. He has rhabdomyolysis. His initial EKG also showed some T-wave inversions and biphasic t waves that were concerning for ischemia or could be normal for a young, healthy man. He denied chest pressure, shortness of breath, cardiac family or personal history and chest pain other than the achey, muscular pain he felt in his entire chest and abdomen. However, there was initial  concern for MI, as CK-MB is generally indicative of cardiac, rather than muscle damage. His repeat EKG and troponins were normal, however, and he feels much better; he also has had normal colored urine starting on 1/17. His CK level was difficult to trend as his first readings were not quantitated beyond ">50,000"; for the first time this morning, his CK was <50,000 at 35,439. The patient continues to improve clinically.  Rhabdomyolysis: Patient was aggressively resuscitated with IV NS (3 boluses, then NS running at 351ml/hr throughout his hospitalization). Oxy IR was written PRN for inpatient pain, but the patient did not use it. CK and transaminases downtrended (CK: >50,000 --> 35,439 --> 34164, AST: 881-->1017-->797-->695). Interestingly, transaminases (especially AST) are present in muscle tissues, and therefore elevated LFTs are associated with rhabdomyolysis in the absence of liver injury. It is most common to see elevated AST (occurs in 93% of rhabdomyolysis patients; elevated ALT present in 75%), as aspartate aminotransferase is expected to be released from damaged muscle. The AST is expected to resolve in parallel with the decreasing CK and is downtrending, which was apparent in his case. Though his CK was still greatly elevated on day of discharge, it  was consistently downtrending, he appeared extremely well clinically and his urine was pale yellow. He has close follow up for a repeat CK 2 days after discharge.   T-Wave Inversions and Biphasic T Waves on EKG: CKMB was elevated, but troponins and repeat EKG negative. Likely no cardiac involvement in this young, healthy man. Initial chest pain appeared to be musculoskeletal (tender to palpation, patient said it felt like the muscle soreness that he feels after lifting). He had no chest pain at discharge.  Discharge Vitals:   BP 131/72 mmHg  Pulse 64  Temp(Src) 98.3 F (36.8 C) (Oral)  Resp 16  Ht 5' 10.75" (1.797 m)  Wt 199 lb 14.4 oz (90.674  kg)  BMI 28.08 kg/m2  SpO2 100%  Discharge Labs:  Results for orders placed or performed during the hospital encounter of 06/07/14 (from the past 24 hour(s))  Comprehensive metabolic panel     Status: Abnormal   Collection Time: 06/10/14  4:02 AM  Result Value Ref Range   Sodium 141 135 - 145 mmol/L   Potassium 3.9 3.5 - 5.1 mmol/L   Chloride 107 96 - 112 mEq/L   CO2 24 19 - 32 mmol/L   Glucose, Bld 87 70 - 99 mg/dL   BUN 5 (L) 6 - 23 mg/dL   Creatinine, Ser 1.470.87 0.50 - 1.35 mg/dL   Calcium 9.0 8.4 - 82.910.5 mg/dL   Total Protein 6.4 6.0 - 8.3 g/dL   Albumin 3.6 3.5 - 5.2 g/dL   AST 562695 (H) 0 - 37 U/L   ALT 338 (H) 0 - 53 U/L   Alkaline Phosphatase 49 39 - 117 U/L   Total Bilirubin 0.7 0.3 - 1.2 mg/dL   GFR calc non Af Amer >90 >90 mL/min   GFR calc Af Amer >90 >90 mL/min   Anion gap 10 5 - 15  CK     Status: Abnormal   Collection Time: 06/10/14  4:02 AM  Result Value Ref Range   Total CK 35439 (H) 7 - 232 U/L    Signed: Dionne AnoJulia Carlen Rebuck, MD 06/10/2014, 3:29 PM    Services Ordered on Discharge: none Equipment Ordered on Discharge: none

## 2014-06-12 ENCOUNTER — Ambulatory Visit (INDEPENDENT_AMBULATORY_CARE_PROVIDER_SITE_OTHER): Payer: 59 | Admitting: Internal Medicine

## 2014-06-12 ENCOUNTER — Encounter: Payer: Self-pay | Admitting: Internal Medicine

## 2014-06-12 VITALS — BP 143/73 | HR 68 | Temp 97.9°F | Ht 70.0 in | Wt 189.7 lb

## 2014-06-12 DIAGNOSIS — M6282 Rhabdomyolysis: Secondary | ICD-10-CM

## 2014-06-12 LAB — COMPLETE METABOLIC PANEL WITH GFR
ALK PHOS: 53 U/L (ref 39–117)
ALT: 331 U/L — AB (ref 0–53)
AST: 261 U/L — AB (ref 0–37)
Albumin: 4.4 g/dL (ref 3.5–5.2)
BUN: 9 mg/dL (ref 6–23)
CALCIUM: 9.9 mg/dL (ref 8.4–10.5)
CO2: 30 mEq/L (ref 19–32)
Chloride: 103 mEq/L (ref 96–112)
Creat: 1.01 mg/dL (ref 0.50–1.35)
GFR, Est African American: >89 mL/min
Glucose, Bld: 77 mg/dL (ref 70–99)
Potassium: 4.2 mEq/L (ref 3.5–5.3)
SODIUM: 143 meq/L (ref 135–145)
Total Bilirubin: 0.3 mg/dL (ref 0.2–1.2)
Total Protein: 7.2 g/dL (ref 6.0–8.3)

## 2014-06-12 LAB — CK: Total CK: 5712 U/L (ref 7–232)

## 2014-06-12 NOTE — Discharge Instructions (Signed)
Drink A LOT of gatorade and water (more gatorade than water) and do NO physical activity for 1 week. That means no work, no school, no working out, no lifting, no sex. It is extremely important that you take it easy while you recover from this very serious episode!  Rhabdomyolysis Rhabdomyolysis is the breakdown of muscle fibers due to injury. The injury may come from physical damage to the muscle like an injury but other causes are:  High fever (hyperthermia).  Seizures (convulsions).  Low phosphate levels.  Diseases of metabolism.  Heatstroke.  Drug toxicity.  Over exertion.  Alcoholism.  Muscle is cut off from oxygen (anoxia).  The squeezing of nerves and blood vessels (compartment syndrome). Some drugs which may cause the breakdown of muscle are:  Antibiotics.  Statins.  Alcohol.  Animal toxins. Myoglobin is a substance which helps muscle use oxygen. When the muscle is damaged, the myoglobin is released into the bloodstream. It is filtered out of the bloodstream by the kidneys. Myoglobin may block up the kidneys. This may cause damage, such as kidney failure. It also breaks down into other damaging toxic parts, which also cause kidney failure.  SYMPTOMS   Dark, red, or tea colored urine.  Weakness of affected muscles.  Weight gain from water retention.  Joint aches and pains.  Irregular heart from high potassium in the blood.  Muscle tenderness or aching.  Generalized weakness.  Seizures.  Feeling tired (fatigue). DIAGNOSIS  Your caregiver may find muscle tenderness on exam and suspect the problem. Urine tests and blood work can confirm the problem. TREATMENT   Early and aggressive treatment with large amounts of fluids may help prevent kidney failure.  Water producing medicine (diuretic) may be used to help flush the kidneys.  High potassium and calcium problems (electrolyte) in your blood may need treatment. HOME CARE INSTRUCTIONS  This problem  is usually cared for in a hospital. If you are allowed to go home and require dialysis, make sure you keep all appointments for lab work and dialysis. Not doing so could result in death. Document Released: 04/21/2004 Document Revised: 08/01/2011 Document Reviewed: 11/03/2008 [facility] Patient Information 2015 [place], [place]. This information is not intended to replace advice given to you by your health care provider. Make sure you discuss any questions you have with your health care provider.

## 2014-06-12 NOTE — Patient Instructions (Signed)
Thank you for coming to clinic today [name].  General instructions: -Glad to hear feeling much better. -Stop by the lab on her way out. -As long as your labs are continuing to improve, I don't think we need to check your labs again unless you develops new symptoms or recurrent pain or dark urine. -We will be happy to follow you in our clinic if you have any other medical problems. -Otherwise you can make an appointment in a year for a routine physical.  Please bring your medicines with you each time you come.   Medicines may be  Eye drops  Herbal   Vitamins  Pills  Seeing these help us take care of you.

## 2014-06-12 NOTE — Assessment & Plan Note (Addendum)
Asymptomatic currently.  Never had any evidence of kidney failure during his hospitalization.  Otherwise healthy. -Recheck CMP and CK today. -If trending down, will have him return PRN for recurrent symptoms or new concerns. -Okay to return to work Monday. -Increased activity gradually.

## 2014-06-12 NOTE — Progress Notes (Signed)
Medicine attending: Medical history, presenting problems, physical findings, and medications, reviewed with Dr Mitzie NaEverett Moding on the day of the patient's visit and I concur with his evaluation and management plan.

## 2014-06-12 NOTE — Progress Notes (Signed)
   Subjective:    Patient ID: Harry Alvarado, male    DOB: [birth year], 24 y.o.   MRN: [id number]  HPI Harry LunaJonathan Fluellen is a 24 year old man with no past medical history who presents for hospital follow up.  He was hospitalized from 06/07/14 through 06/10/14 for rhabdomyolysis after he developed muscle pain and dark urine after lifting weights.  CK was 34,164 on discharge with AST of 695. Since discharge, he's been doing great with no complaints. His muscle pain is completely resolved and he is not noticed any darkening of his urine. He denies any chest pain, shortness of breath, changes in urination, fever, or chills. He has been taking it easy without any physical exertion.   Review of Systems  Constitutional: Negative for fever, chills and activity change.  HENT: Negative for congestion, rhinorrhea and sore throat.   Respiratory: Negative for shortness of breath.   Cardiovascular: Negative for chest pain.  Gastrointestinal: Negative for nausea, vomiting, abdominal pain, diarrhea and constipation.  Genitourinary: Negative for dysuria.  Musculoskeletal: Negative for myalgias, back pain and arthralgias.  Skin: Negative for rash.  Neurological: Negative for dizziness, weakness and numbness.       Objective:   Physical Exam  Constitutional: He is oriented to person, place, and time. He appears well-developed and well-nourished. No distress.  HENT:  Head: Normocephalic and atraumatic.  Mouth/Throat: No oropharyngeal exudate.  Eyes: Conjunctivae and EOM are normal. Pupils are equal, round, and reactive to light. No scleral icterus.  Cardiovascular: Normal rate, regular rhythm and normal heart sounds.   Pulmonary/Chest: Effort normal and breath sounds normal. No respiratory distress.  Abdominal: Soft. Bowel sounds are normal. He exhibits no distension. There is no tenderness.  Musculoskeletal: Normal range of motion. He exhibits no edema or tenderness.  Neurological: He is alert and  oriented to person, place, and time. No cranial nerve deficit. He exhibits normal muscle tone.  Skin: Skin is warm and dry. No rash noted. No erythema.          Assessment & Plan:  Please see problem-based assessment and plan.

## 2014-11-22 ENCOUNTER — Ambulatory Visit (INDEPENDENT_AMBULATORY_CARE_PROVIDER_SITE_OTHER): Payer: 59 | Admitting: Emergency Medicine

## 2014-11-22 VITALS — BP 120/64 | HR 67 | Temp 98.5°F | Resp 17 | Ht 70.5 in | Wt 185.8 lb

## 2014-11-22 DIAGNOSIS — R079 Chest pain, unspecified: Secondary | ICD-10-CM | POA: Diagnosis not present

## 2014-11-22 DIAGNOSIS — M940 Chondrocostal junction syndrome [Tietze]: Secondary | ICD-10-CM | POA: Diagnosis not present

## 2014-11-22 LAB — POCT URINALYSIS DIPSTICK
BILIRUBIN UA: NEGATIVE
Blood, UA: NEGATIVE
GLUCOSE UA: NEGATIVE
KETONES UA: NEGATIVE
LEUKOCYTES UA: NEGATIVE
NITRITE UA: NEGATIVE
PH UA: 5.5
SPEC GRAV UA: 1.025
UROBILINOGEN UA: 0.2

## 2014-11-22 LAB — POCT UA - MICROSCOPIC ONLY
CRYSTALS, UR, HPF, POC: NEGATIVE
Casts, Ur, LPF, POC: NEGATIVE
YEAST UA: NEGATIVE

## 2014-11-22 LAB — CK: CK TOTAL: 100 U/L (ref 7–232)

## 2014-11-22 MED ORDER — NAPROXEN SODIUM 550 MG PO TABS: 550.0000 mg | ORAL_TABLET | Freq: Two times a day (BID) | ORAL | Status: DC

## 2014-11-22 NOTE — Patient Instructions (Signed)
Costochondritis Costochondritis, sometimes called Tietze syndrome, is a swelling and irritation (inflammation) of the tissue (cartilage) that connects your ribs with your breastbone (sternum). It causes pain in the chest and rib area. Costochondritis usually goes away on its own over time. It can take up to 6 weeks or longer to get better, especially if you are unable to limit your activities. CAUSES  Some cases of costochondritis have no known cause. Possible causes include:  Injury (trauma).  Exercise or activity such as lifting.  Severe coughing. SIGNS AND SYMPTOMS  Pain and tenderness in the chest and rib area.  Pain that gets worse when coughing or taking deep breaths.  Pain that gets worse with specific movements. DIAGNOSIS  Your health care provider will do a physical exam and ask about your symptoms. Chest X-rays or other tests may be done to rule out other problems. TREATMENT  Costochondritis usually goes away on its own over time. Your health care provider may prescribe medicine to help relieve pain. HOME CARE INSTRUCTIONS   Avoid exhausting physical activity. Try not to strain your ribs during normal activity. This would include any activities using chest, abdominal, and side muscles, especially if heavy weights are used.  Apply ice to the affected area for the first 2 days after the pain begins.  Put ice in a plastic bag.  Place a towel between your skin and the bag.  Leave the ice on for 20 minutes, 2-3 times a day.  Only take over-the-counter or prescription medicines as directed by your health care provider. SEEK MEDICAL CARE IF:  You have redness or swelling at the rib joints. These are signs of infection.  Your pain does not go away despite rest or medicine. SEEK IMMEDIATE MEDICAL CARE IF:   Your pain increases or you are very uncomfortable.  You have shortness of breath or difficulty breathing.  You cough up blood.  You have worse chest pains,  sweating, or vomiting.  You have a fever or persistent symptoms for more than 2-3 days.  You have a fever and your symptoms suddenly get worse. MAKE SURE YOU:   Understand these instructions.  Will watch your condition.  Will get help right away if you are not doing well or get worse. Document Released: 02/16/2005 Document Revised: 02/27/2013 Document Reviewed: 12/11/2012 [employer] Patient Information 2015 [employer]. This information is not intended to replace advice given to you by your health care provider. Make sure you discuss any questions you have with your health care provider.  

## 2014-11-22 NOTE — Progress Notes (Signed)
Subjective:  Patient ID: Harry Alvarado, male    DOB: Sep 25, 1990  Age: 24 y.o. MRN: [id number]  CC: Chest Pain   HPI Harry Alvarado presents  with a month's long history of chest pain. Said the pain is intermittent and not long-lived sometimes increased with activity. Harry Alvarado denies any shortness of breath nausea vomiting or radiation of pain. His has no diaphoresis or nausea or vomiting. No history of injury. Harry Alvarado was evaluated in the emergency room and found to have an elevated CK with rhabdomyolysis. Harry Alvarado responded to treatment for that. Harry Alvarado said this is a different pain. Harry Alvarado's had some relief with over-the-counter non-steroidal inflammatory  History Harry Alvarado has a past medical history of Allergy.   Harry Alvarado has no past surgical history on file.   His  family history is not on file.  Harry Alvarado   reports that Harry Alvarado has never smoked. Harry Alvarado does not have any smokeless tobacco history on file. Harry Alvarado reports that Harry Alvarado drinks alcohol. Harry Alvarado reports that Harry Alvarado does not use illicit drugs.  No outpatient prescriptions prior to visit.   No facility-administered medications prior to visit.    History   Social History  . Marital Status: Single    Spouse Name: N/A  . Number of Children: N/A  . Years of Education: N/A   Social History Main Topics  . Smoking status: Never Smoker   . Smokeless tobacco: Not on file  . Alcohol Use: Yes  . Drug Use: No  . Sexual Activity: Not on file   Other Topics Concern  . None   Social History Narrative     Review of Systems  Constitutional: Negative for fever, chills and appetite change.  HENT: Negative for congestion, ear pain, postnasal drip, sinus pressure and sore throat.   Eyes: Negative for pain and redness.  Respiratory: Negative for cough, shortness of breath and wheezing.   Cardiovascular: Positive for chest pain. Negative for leg swelling.  Gastrointestinal: Negative for nausea, vomiting, abdominal pain, diarrhea, constipation and blood in stool.  Endocrine:  Negative for polyuria.  Genitourinary: Negative for dysuria, urgency, frequency and flank pain.  Musculoskeletal: Negative for gait problem.  Skin: Negative for rash.  Neurological: Negative for weakness and headaches.  Psychiatric/Behavioral: Negative for confusion and decreased concentration. The patient is not nervous/anxious.     Objective:  BP 120/64 mmHg  Pulse 67  Temp(Src) 98.5 F (36.9 C) (Oral)  Resp 17  Ht 5' 10.5" (1.791 m)  Wt 185 lb 12.8 oz (84.278 kg)  BMI 26.27 kg/m2  SpO2 99%  Physical Exam  Constitutional: Harry Alvarado is oriented to person, place, and time. Harry Alvarado appears well-developed and well-nourished. No distress.  HENT:  Head: Normocephalic and atraumatic.  Right Ear: External ear normal.  Left Ear: External ear normal.  Nose: Nose normal.  Eyes: Conjunctivae and EOM are normal. Pupils are equal, round, and reactive to light. No scleral icterus.  Neck: Normal range of motion. Neck supple. No tracheal deviation present.  Cardiovascular: Normal rate, regular rhythm and normal heart sounds.   Pulmonary/Chest: Effort normal. No respiratory distress. Harry Alvarado has no wheezes. Harry Alvarado has no rales.  Abdominal: Harry Alvarado exhibits no mass. There is no tenderness. There is no rebound and no guarding.  Musculoskeletal: Harry Alvarado exhibits no edema.  Lymphadenopathy:    Harry Alvarado has no cervical adenopathy.  Neurological: Harry Alvarado is alert and oriented to person, place, and time. Coordination normal.  Skin: Skin is warm and dry. No rash noted.  Psychiatric: Harry Alvarado has a normal mood and affect.  His behavior is normal.      Assessment & Plan:   Harry Alvarado was seen today for chest pain.  Diagnoses and all orders for this visit:  Chest pain, unspecified chest pain type Orders: -     CK -     POCT urinalysis dipstick -     POCT UA - Microscopic Only -     EKG 12-Lead   Harry Alvarado does not currently have medications on file.  No orders of the defined types were placed in this encounter.     Harry Alvarado describes  this as different from his previous rhabdomyolysis. I think if the pain which is reproducible but palpation on the right side of his sternum. Harry Alvarado has not improved with medication when you do have a value by the cardiologist  Appropriate red flag conditions were discussed with the patient as well as actions that should be taken.  Patient expressed his understanding.  Follow-up: No Follow-up on file.  Carmelina DaneAnderson, Petros Ahart S, MD   Results for orders placed or performed in visit on 11/22/14  POCT urinalysis dipstick  Result Value Ref Range   Color, UA yellow    Clarity, UA clear    Glucose, UA neg    Bilirubin, UA neg    Ketones, UA neg    Spec Grav, UA 1.025    Blood, UA neg    pH, UA 5.5    Protein, UA trace    Urobilinogen, UA 0.2    Nitrite, UA neg    Leukocytes, UA Negative Negative  POCT UA - Microscopic Only  Result Value Ref Range   WBC, Ur, HPF, POC 0-1    RBC, urine, microscopic 0-2    Bacteria, U Microscopic trace    Mucus, UA trace    Epithelial cells, urine per micros 0-1    Crystals, Ur, HPF, POC neg    Casts, Ur, LPF, POC neg    Yeast, UA neg

## 2015-01-02 ENCOUNTER — Emergency Department (HOSPITAL_COMMUNITY)
Admission: EM | Admit: 2015-01-02 | Discharge: 2015-01-02 | Disposition: A | Discharge status: Home / Self Care | Payer: 59 | Attending: Emergency Medicine | Admitting: Emergency Medicine

## 2015-01-02 ENCOUNTER — Emergency Department (HOSPITAL_COMMUNITY): Payer: 59

## 2015-01-02 ENCOUNTER — Encounter (HOSPITAL_COMMUNITY): Payer: Self-pay | Admitting: Emergency Medicine

## 2015-01-02 DIAGNOSIS — R202 Paresthesia of skin: Secondary | ICD-10-CM | POA: Diagnosis not present

## 2015-01-02 DIAGNOSIS — R079 Chest pain, unspecified: Secondary | ICD-10-CM | POA: Diagnosis present

## 2015-01-02 DIAGNOSIS — R0789 Other chest pain: Secondary | ICD-10-CM | POA: Insufficient documentation

## 2015-01-02 LAB — CBC
HEMATOCRIT: 43.3 % (ref 39.0–52.0)
HEMOGLOBIN: 14.8 g/dL (ref 13.0–17.0)
MCH: 31.3 pg (ref 26.0–34.0)
MCHC: 34.2 g/dL (ref 30.0–36.0)
MCV: 91.5 fL (ref 78.0–100.0)
Platelets: 288 10*3/uL (ref 150–400)
RBC: 4.73 MIL/uL (ref 4.22–5.81)
RDW: 12.1 % (ref 11.5–15.5)
WBC: 5 10*3/uL (ref 4.0–10.5)

## 2015-01-02 LAB — BASIC METABOLIC PANEL
Anion gap: 8 (ref 5–15)
BUN: 14 mg/dL (ref 6–20)
CHLORIDE: 107 mmol/L (ref 101–111)
CO2: 26 mmol/L (ref 22–32)
Calcium: 9.4 mg/dL (ref 8.9–10.3)
Creatinine, Ser: 1.11 mg/dL (ref 0.61–1.24)
GFR calc Af Amer: >60 mL/min (ref >60)
GFR calc non Af Amer: >60 mL/min (ref >60)
Glucose, Bld: 100 mg/dL — ABNORMAL HIGH (ref 65–99)
POTASSIUM: 4.2 mmol/L (ref 3.5–5.1)
Sodium: 141 mmol/L (ref 135–145)

## 2015-01-02 LAB — I-STAT TROPONIN, ED: TROPONIN I, POC: 0 ng/mL (ref 0.00–0.08)

## 2015-01-02 NOTE — ED Provider Notes (Signed)
CSN: [id number]     Arrival date & time 01/02/15  1140 History   First MD Initiated Contact with Patient 01/02/15 1202     Chief Complaint  Patient presents with  . Chest Pain     (Consider location/radiation/quality/duration/timing/severity/associated sxs/prior Treatment) HPI  24 year old male, otherwise healthy, who presents with chest pain. Recent history of rhabdomyolysis in 05/2014, and reports that since treatment, he has had intermittent chest discomfort. Reports that he occasional has stinging tingling sensation over the center of his chest that is non-radiating. Sensation lasts for 5-10 seconds and occurs randomly throughout the day. Has been taking naprosyn, but notes that it does not affect onset of symptoms. Notes that he usually only notices this at rest, and does not come on with exercise. Denies associating SOB, palpitations, lightheadedness, or syncope. Denies recent illness, pleuritic nature of symptoms, edema.  Past Medical History  Diagnosis Date  . Allergy    History reviewed. No pertinent past surgical history. History reviewed. No pertinent family history. Social History  Substance Use Topics  . Smoking status: Never Smoker   . Smokeless tobacco: None  . Alcohol Use: Yes     Comment: socially    Review of Systems 10/14 systems reviewed and are negative other than those stated in the HPI  Allergies  Meningococcal vaccines  Home Medications   Prior to Admission medications   Medication Sig Start Date End Date Taking? Authorizing Provider  naproxen sodium (ANAPROX DS) 550 MG tablet Take 1 tablet (550 mg total) by mouth 2 (two) times daily with a meal. 11/22/14 11/22/15 Yes Carmelina Dane, MD   BP 135/79 mmHg  Pulse 87  Temp(Src) 98.1 F (36.7 C) (Oral)  Resp 20  Ht  (1.778 m)  Wt 188 lb (85.276 kg)  BMI 26.98 kg/m2  SpO2 100% Physical Exam Physical Exam  Nursing note and vitals reviewed. Constitutional: Well developed, well nourished,  non-toxic, and in no acute distress Head: Normocephalic and atraumatic.  Mouth/Throat: Oropharynx is clear and moist.  Neck: Normal range of motion. Neck supple.  Cardiovascular: Normal rate and regular rhythm.  No edema. Pulmonary/Chest: Effort normal and breath sounds normal. No chest tenderness. Abdominal: Soft. There is no tenderness. There is no rebound and no guarding.  Musculoskeletal: Normal range of motion.  Neurological: Alert, no facial droop, fluent speech, moves all extremities symmetrically Skin: Skin is warm and dry.  Psychiatric: Cooperative  ED Course  Procedures (including critical care time) Labs Review Labs Reviewed  BASIC METABOLIC PANEL - Abnormal; Notable for the following:    Glucose, Bld 100 (*)    All other components within normal limits  CBC  I-STAT TROPOININ, ED    Imaging Review Dg Chest 2 View  01/02/2015   CLINICAL DATA:  Chronic chest pain.  EXAM: CHEST  2 VIEW  COMPARISON:  June 07, 2014.  FINDINGS: The heart size and mediastinal contours are within normal limits. Both lungs are clear. No pneumothorax or pleural effusion is noted. The visualized skeletal structures are unremarkable.  IMPRESSION: No active cardiopulmonary disease.   Electronically Signed   By: Lupita Raider, M.D.   On: 01/02/2015 12:22   I, Lavera Guise, personally reviewed and evaluated these images and lab results as part of my medical decision-making.   EKG Interpretation   Date/Time:  Friday January 02 2015 11:46:33 EDT Ventricular Rate:  81 PR Interval:  132 QRS Duration: 88 QT Interval:  396 QTC Calculation: 460 R Axis:  91 Text Interpretation:  Normal sinus rhythm with sinus arrhythmia S1Q3T3  pattern, rightward axis no significant change from prior Confirmed by LIU  MD, DANA [phone]) on 01/02/2015 12:13:41 PM      MDM   Final diagnoses:  Chest discomfort    In short, this is a 24 year old male with prior history of rhabdomyolysis who presents with  intermittent chest discomfort. He is well appearing. VS are within normal limits. EKG shows no stigmata of arrhythmia or evidence of ischemia. CXR shows no acute cardiopulmonary processes. Basic blood work unremarkable. Ongoing processes for several months now, and seems benign in nature. No concern for serious or toxic cardiopulmonary etiology of symptoms. Arranged referral to set up PCP care.  Strict return and follow-up instructions reviewed. He expressed understanding of all discharge instructions and felt comfortable with the plan of care.     Lavera Guise, MD 01/02/15 2012

## 2015-01-02 NOTE — ED Notes (Signed)
Patient states he has had a stinging in the chest for a few months.  Patient states worsened this morning.   Patient states he had Rhabdo in January and he states it doesn't feel like that.  Patient states no other symptoms.

## 2015-01-02 NOTE — Discharge Instructions (Signed)
Return without fail for worsening symptoms, including worsening pain, chest pain while exercising, passing out while exercising, passing out for no good reason, progressive shortness of breath, or any other symptoms concerning to you.  Chest Pain (Nonspecific) It is often hard to give a specific diagnosis for the cause of chest pain. There is always a chance that your pain could be related to something serious, such as a heart attack or a blood clot in the lungs. You need to follow up with your health care provider for further evaluation. CAUSES   Heartburn.  Pneumonia or bronchitis.  Anxiety or stress.  Inflammation around your heart (pericarditis) or lung (pleuritis or pleurisy).  A blood clot in the lung.  A collapsed lung (pneumothorax). It can develop suddenly on its own (spontaneous pneumothorax) or from trauma to the chest.  Shingles infection (herpes zoster virus). The chest wall is composed of bones, muscles, and cartilage. Any of these can be the source of the pain.  The bones can be bruised by injury.  The muscles or cartilage can be strained by coughing or overwork.  The cartilage can be affected by inflammation and become sore (costochondritis). DIAGNOSIS  Lab tests or other studies may be needed to find the cause of your pain. Your health care provider may have you take a test called an ambulatory electrocardiogram (ECG). An ECG records your heartbeat patterns over a 24-hour period. You may also have other tests, such as:  Transthoracic echocardiogram (TTE). During echocardiography, sound waves are used to evaluate how blood flows through your heart.  Transesophageal echocardiogram (TEE).  Cardiac monitoring. This allows your health care provider to monitor your heart rate and rhythm in real time.  Holter monitor. This is a portable device that records your heartbeat and can help diagnose heart arrhythmias. It allows your health care provider to track your heart  activity for several days, if needed.  Stress tests by exercise or by giving medicine that makes the heart beat faster. TREATMENT   Treatment depends on what may be causing your chest pain. Treatment may include:  Acid blockers for heartburn.  Anti-inflammatory medicine.  Pain medicine for inflammatory conditions.  Antibiotics if an infection is present.  You may be advised to change lifestyle habits. This includes stopping smoking and avoiding alcohol, caffeine, and chocolate.  You may be advised to keep your head raised (elevated) when sleeping. This reduces the chance of acid going backward from your stomach into your esophagus. Most of the time, nonspecific chest pain will improve within 2-3 days with rest and mild pain medicine.  HOME CARE INSTRUCTIONS   If antibiotics were prescribed, take them as directed. Finish them even if you start to feel better.  For the next few days, avoid physical activities that bring on chest pain. Continue physical activities as directed.  Do not use any tobacco products, including cigarettes, chewing tobacco, or electronic cigarettes.  Avoid drinking alcohol.  Only take medicine as directed by your health care provider.  Follow your health care provider's suggestions for further testing if your chest pain does not go away.  Keep any follow-up appointments you made. If you do not go to an appointment, you could develop lasting (chronic) problems with pain. If there is any problem keeping an appointment, call to reschedule. SEEK MEDICAL CARE IF:   Your chest pain does not go away, even after treatment.  You have a rash with blisters on your chest.  You have a fever. SEEK IMMEDIATE MEDICAL CARE  IF:   You have increased chest pain or pain that spreads to your arm, neck, jaw, back, or abdomen.  You have shortness of breath.  You have an increasing cough, or you cough up blood.  You have severe back or abdominal pain.  You feel  nauseous or vomit.  You have severe weakness.  You faint.  You have chills. This is an emergency. Do not wait to see if the pain will go away. Get medical help at once. Call your local emergency services (911 in [place] Do not drive yourself to the hospital. MAKE SURE YOU:   Understand these instructions.  Will watch your condition.  Will get help right away if you are not doing well or get worse. Document Released: 02/16/2005 Document Revised: 05/14/2013 Document Reviewed: 12/13/2007 [facility] Patient Information 2015 [place], [place]. This information is not intended to replace advice given to you by your health care provider. Make sure you discuss any questions you have with your health care provider.    Emergency Department Resource Guide 1) Find a Doctor and Pay Out of Pocket Although you won't have to find out who is covered by your insurance plan, it is a good idea to ask around and get recommendations. You will then need to call the office and see if the doctor you have chosen will accept you as a new patient and what types of options they offer for patients who are self-pay. Some doctors offer discounts or will set up payment plans for their patients who do not have insurance, but you will need to ask so you aren't surprised when you get to your appointment.  2) Contact Your Local Health Department Not all health departments have doctors that can see patients for sick visits, but many do, so it is worth a call to see if yours does. If you don't know where your local health department is, you can check in your phone book. The CDC also has a tool to help you locate your state's health department, and many state websites also have listings of all of their local health departments.  3) Find a Walk-in Clinic If your illness is not likely to be very severe or complicated, you may want to try a walk in clinic. These are popping up all over the country in pharmacies, drugstores, and  shopping centers. They're usually staffed by nurse practitioners or physician assistants that have been trained to treat common illnesses and complaints. They're usually fairly quick and inexpensive. However, if you have serious medical issues or chronic medical problems, these are probably not your best option.  No Primary Care Doctor: - Call [facility] Connect at  [phone] - they can help you locate a primary care doctor that  accepts your insurance, provides certain services, etc. - Physician Referral Service- [phone]  Chronic Pain Problems: Organization         Address  Phone   Notes  [facility]  [phone] Patients need to be referred by their primary care doctor.   Medication Assistance: Organization         Address  Phone   Notes  [facility] Medication [facility] [address] [place] [place], [place] [address] [phone] --Must be a resident of [facility] -- Must have NO insurance coverage whatsoever (no Medicaid/ Medicare, etc.) -- The pt. MUST have a primary care doctor that directs their care regularly and follows them in the community   [facility]  [phone]  [employer]  [phone]    Agencies that provide inexpensive medical care: Organization         Address  Phone   Notes  [facility]  [phone]   [facility]    [phone]   [facility] [address] [place], [place] [address] [phone]   [facility] [address], [place] [phone]   [facility]    [phone]   [facility]    [phone]   [facility]  [address], [place] Phone:  [phone], Fax:  [phone] Hours of Operation:  9 am - 6 pm, M-F.  Also accepts Medicaid/Medicare and self-pay.  [facility]  [address], [place] Phone: [phone],  Fax: [phone]. Hours of Operation:  8:30 am - 5:30 pm, M-F.  Also accepts Medicaid and self-pay.  [facility] [place] [address], [place] Phone: [phone]   [facility] [address] Natasha Bence [place], [place] [phone], Ext. [phone] Mondays & Thursdays: 7-9 AM.  First 15 patients are seen on a first come, first serve basis.    Medicaid-accepting [facility] Providers:  Organization         Address  Phone   Notes  [facility] [address], [place] [phone] Also accepts self-pay patients.  [facility] [address] Laurell Joseph[place], [place]  [phone]   [facility] [address], [place] [phone]   [facility] [address] [place] [phone]   Renaye Rakers [address], [place]   [phone] Only accepts [place] patients after they have their name applied to their card.   Self-Pay (no insurance) in [facility]:  Organization         Address  Phone   Notes  Sickle Cell Patients, [facility] [address] [place], [place] [phone]   [facility] [address] [place], [place] [phone]   [facility]  [address], [place] [phone]   [facility]/Dr. Osei-Bonsu  [address], [place] or [address], [place] [phone] Phone number for both [place] and [place] locations is the same.  [facility] [address] [place] [phone]   [facility] [address] [place] or [address] [phone]   [facility] [address] [place] [phone], phone; [phone], fax Sees patients 1st and 3rd Saturday of every month.  Must not qualify for public or private  insurance (i.e. Medicaid, Medicare, [place] Health Choice, Veterans' Benefits)  Household income should be no more than 200% of the poverty level The clinic cannot treat you if you are pregnant or think you are pregnant  Sexually transmitted diseases are not treated at the clinic.

## 2015-03-26 ENCOUNTER — Ambulatory Visit (INDEPENDENT_AMBULATORY_CARE_PROVIDER_SITE_OTHER): Payer: 59 | Admitting: Emergency Medicine

## 2015-03-26 ENCOUNTER — Telehealth: Payer: Self-pay

## 2015-03-26 VITALS — BP 118/78 | HR 71 | Temp 98.6°F | Resp 16 | Ht 70.0 in | Wt 187.8 lb

## 2015-03-26 DIAGNOSIS — R079 Chest pain, unspecified: Secondary | ICD-10-CM

## 2015-03-26 DIAGNOSIS — K219 Gastro-esophageal reflux disease without esophagitis: Secondary | ICD-10-CM

## 2015-03-26 MED ORDER — SUCRALFATE 1 G PO TABS
ORAL_TABLET | ORAL | Status: DC
Start: 2015-03-26 — End: 2015-03-26

## 2015-03-26 MED ORDER — LANSOPRAZOLE 30 MG PO CPDR: 30.0000 mg | DELAYED_RELEASE_CAPSULE | Freq: Every day | ORAL | Status: AC

## 2015-03-26 MED ORDER — SUCRALFATE 1 G PO TABS: ORAL_TABLET | ORAL | Status: DC

## 2015-03-26 MED ORDER — LANSOPRAZOLE 30 MG PO CPDR: 30.0000 mg | DELAYED_RELEASE_CAPSULE | Freq: Every day | ORAL | Status: DC

## 2015-03-26 MED ORDER — CELECOXIB 200 MG PO CAPS: 200.0000 mg | ORAL_CAPSULE | Freq: Two times a day (BID) | ORAL | Status: AC

## 2015-03-26 MED ORDER — CELECOXIB 200 MG PO CAPS: 200.0000 mg | ORAL_CAPSULE | Freq: Two times a day (BID) | ORAL | Status: DC

## 2015-03-26 NOTE — Progress Notes (Signed)
Subjective:  Patient ID: Harry Alvarado, male    DOB: 09-Oct-1990  Age: 24 y.o. MRN: [id number]  CC: chest tightness and Headache   HPI Harry Alvarado presents   With the usual presentation of chest pain. He was first seen for chest pain in January. He's been the emergency room and here twice with no abnormality on diagnostic studies. He's had negative chest x-ray labs in [facility]. He has symptoms suggestive of gastroesophageal reflux disorder. He has frequent nighttime pain. He works in a physically active job working with [employer] for [employer].   he describes the pain  As long as sternal border worse on the left. He has no cough or coryza or shortness of breath. No wheezing. No fever chills. No history of direct injury. He's been on Anaprox and that didn't affect his symptoms.  History Harry Alvarado has a past medical history of Allergy.   He has no past surgical history on file.   His  family history is not on file.  He   reports that he has never smoked. He does not have any smokeless tobacco history on file. He reports that he drinks alcohol. He reports that he does not use illicit drugs.  Outpatient Prescriptions Prior to Visit  Medication Sig Dispense Refill  . naproxen sodium (ANAPROX DS) 550 MG tablet Take 1 tablet (550 mg total) by mouth 2 (two) times daily with a meal. 60 tablet 2   No facility-administered medications prior to visit.    Social History   Social History  . Marital Status: Single    Spouse Name: N/A  . Number of Children: N/A  . Years of Education: N/A   Social History Main Topics  . Smoking status: Never Smoker   . Smokeless tobacco: None  . Alcohol Use: Yes     Comment: socially  . Drug Use: No  . Sexual Activity: Not Asked   Other Topics Concern  . None   Social History Narrative     Review of Systems  Constitutional: Negative for fever, chills and appetite change.  HENT: Negative for  congestion, ear pain, postnasal drip, sinus pressure and sore throat.   Eyes: Negative for pain and redness.  Respiratory: Negative for cough, shortness of breath and wheezing.   Cardiovascular: Negative for leg swelling.  Gastrointestinal: Negative for nausea, vomiting, abdominal pain, diarrhea, constipation and blood in stool.  Endocrine: Negative for polyuria.  Genitourinary: Negative for dysuria, urgency, frequency and flank pain.  Musculoskeletal: Negative for gait problem.  Skin: Negative for rash.  Neurological: Negative for weakness and headaches.  Psychiatric/Behavioral: Negative for confusion and decreased concentration. The patient is not nervous/anxious.     Objective:  BP 118/78 mmHg  Pulse 71  Temp(Src) 98.6 F (37 C) (Oral)  Resp 16  Ht  (1.778 m)  Wt 187 lb 12.8 oz (85.186 kg)  BMI 26.95 kg/m2  SpO2 99%  Physical Exam  Constitutional: He is oriented to person, place, and time. He appears well-developed and well-nourished. No distress.  HENT:  Head: Normocephalic and atraumatic.  Right Ear: External ear normal.  Left Ear: External ear normal.  Nose: Nose normal.  Eyes: Conjunctivae and EOM are normal. Pupils are equal, round, and reactive to light. No scleral icterus.  Neck: Normal range of motion. Neck supple. No tracheal deviation present.  Cardiovascular: Normal rate, regular rhythm and normal heart sounds.   Pulmonary/Chest: Effort normal. No respiratory distress. He has no wheezes. He  has no rales. He exhibits tenderness (Along the left sternal border.).  Abdominal: He exhibits no mass. There is no tenderness. There is no rebound and no guarding.  Musculoskeletal: He exhibits no edema.  Lymphadenopathy:    He has no cervical adenopathy.  Neurological: He is alert and oriented to person, place, and time. Coordination normal.  Skin: Skin is warm and dry. No rash noted.  Psychiatric: He has a normal mood and affect. His behavior is normal.       Assessment & Plan:   Christiane HaJonathan was seen today for chest tightness and headache.  Diagnoses and all orders for this visit:  Chest pain, unspecified chest pain type  Gastroesophageal reflux disease, esophagitis presence not specified  Other orders -     Discontinue: celecoxib (CELEBREX) 200 MG capsule; Take 1 capsule (200 mg total) by mouth 2 (two) times daily. -     Discontinue: lansoprazole (PREVACID) 30 MG capsule; Take 1 capsule (30 mg total) by mouth daily at 12 noon. -     Discontinue: sucralfate (CARAFATE) 1 G tablet; 1 tablet 1 hr ac and hs -     celecoxib (CELEBREX) 200 MG capsule; Take 1 capsule (200 mg total) by mouth 2 (two) times daily. -     lansoprazole (PREVACID) 30 MG capsule; Take 1 capsule (30 mg total) by mouth daily at 12 noon. -     sucralfate (CARAFATE) 1 G tablet; 1 tablet 1 hr ac and hs   I have discontinued Mr. Oppenheimer's naproxen sodium. I am also having him maintain his celecoxib, lansoprazole, and sucralfate.  Meds ordered this encounter  Medications  . DISCONTD: celecoxib (CELEBREX) 200 MG capsule    Sig: Take 1 capsule (200 mg total) by mouth 2 (two) times daily.    Dispense:  60 capsule    Refill:  1  . DISCONTD: lansoprazole (PREVACID) 30 MG capsule    Sig: Take 1 capsule (30 mg total) by mouth daily at 12 noon.    Dispense:  30 capsule    Refill:  2  . DISCONTD: sucralfate (CARAFATE) 1 G tablet    Sig: 1 tablet 1 hr ac and hs    Dispense:  120 tablet    Refill:  0  . celecoxib (CELEBREX) 200 MG capsule    Sig: Take 1 capsule (200 mg total) by mouth 2 (two) times daily.    Dispense:  60 capsule    Refill:  1  . lansoprazole (PREVACID) 30 MG capsule    Sig: Take 1 capsule (30 mg total) by mouth daily at 12 noon.    Dispense:  30 capsule    Refill:  2  . sucralfate (CARAFATE) 1 G tablet    Sig: 1 tablet 1 hr ac and hs    Dispense:  120 tablet    Refill:  0    Appropriate red flag conditions were discussed with the patient as well  as actions that should be taken.  Patient expressed his understanding.  Follow-up: Return if symptoms worsen or fail to improve.  Carmelina DaneAnderson, Porcha Deblanc S, MD

## 2015-03-26 NOTE — Telephone Encounter (Signed)
Called to cancel RXs per patient. New prescriptions were sent over to [facility] on [address]

## 2015-03-26 NOTE — Patient Instructions (Signed)

## 2015-04-06 ENCOUNTER — Telehealth: Payer: Self-pay

## 2015-04-06 NOTE — Telephone Encounter (Signed)
Medications Ordered This Encounter       Disp Refills Start End    celecoxib (CELEBREX) 200 MG capsule 60 capsule 1 03/26/2015     Take 1 capsule (200 mg total) by mouth 2 (two) times daily. - Oral    lansoprazole (PREVACID) 30 MG capsule 30 capsule 2 03/26/2015     Take 1 capsule (30 mg total) by mouth daily at 12 noon. - Oral    sucralfate (CARAFATE) 1 G tablet 120 tablet 0 03/26/2015     1 tablet 1 hr ac and hs

## 2015-04-06 NOTE — Telephone Encounter (Signed)
Pt is asking if he can work out while taking current meds prescribed and if so what exercises would be Allowed  Best phone for pt is [phone]

## 2015-04-06 NOTE — Telephone Encounter (Signed)
Safe to exercise and can do any exercise. CP he was having was secondary to GERD. If CP arises again, however, should RTC

## 2015-04-14 ENCOUNTER — Telehealth: Payer: Self-pay

## 2015-04-14 NOTE — Telephone Encounter (Signed)
Pt was seen by dr Dareen Pianoanderson on 03/26/15 and ever since he was started on the meds Dareen Pianoanderson gave him urinary frequencey has occurred and he would like to know if this is a side effect

## 2015-04-15 NOTE — Telephone Encounter (Signed)
No, he probably ought to have a UA done

## 2015-04-17 NOTE — Telephone Encounter (Signed)
Pt states that he is now doing fine.  He just had some bowel issues.

## 2015-04-22 ENCOUNTER — Telehealth: Payer: Self-pay

## 2015-04-22 ENCOUNTER — Telehealth: Payer: Self-pay | Admitting: Cardiology

## 2015-04-22 NOTE — Telephone Encounter (Signed)
[facility] 04/24/15 with Dr Jens Somrenshaw.  [facility] Dr Ludwig Clarksrenshaw's schedule on 04/24/15. lp

## 2015-04-22 NOTE — Telephone Encounter (Signed)
-  FAXED NOTES TO [facility] ON 04/22/15 @ 314 [address]J

## 2015-04-23 DIAGNOSIS — T7840XA Allergy, unspecified, initial encounter: Secondary | ICD-10-CM | POA: Insufficient documentation

## 2015-04-24 ENCOUNTER — Ambulatory Visit (INDEPENDENT_AMBULATORY_CARE_PROVIDER_SITE_OTHER): Payer: 59 | Admitting: Cardiology

## 2015-04-24 ENCOUNTER — Encounter: Payer: Self-pay | Admitting: Cardiology

## 2015-04-24 ENCOUNTER — Encounter: Payer: Self-pay | Admitting: *Deleted

## 2015-04-24 VITALS — BP 116/74 | HR 84 | Ht 70.0 in | Wt 188.6 lb

## 2015-04-24 DIAGNOSIS — R072 Precordial pain: Secondary | ICD-10-CM

## 2015-04-24 DIAGNOSIS — R079 Chest pain, unspecified: Secondary | ICD-10-CM | POA: Insufficient documentation

## 2015-04-24 NOTE — Patient Instructions (Signed)
Your physician has requested that you have an echocardiogram. Echocardiography is a painless test that uses sound waves to create images of your heart. It provides your doctor with information about the size and shape of your heart and how well your heart's chambers and valves are working. This procedure takes approximately one hour. There are no restrictions for this procedure.   Your physician recommends that you schedule a follow-up appointment in: AS NEEDED 

## 2015-04-24 NOTE — Assessment & Plan Note (Signed)
Patient's symptoms are atypical. Probable musculoskeletal. Electrocardiogram normal. I will plan an echocardiogram to rule out pericardial effusion/pericarditis and evaluate wall motion. If normal I do not think further cardiac workup indicated. Can consider repeating nonsteroidals in the future.

## 2015-04-24 NOTE — Progress Notes (Signed)
     HPI: 24 year old male for evaluation of chest pain. Patient had episode of rhabdomyolysis in January. In July he noticed substernal chest discomfort. It was continuous lasting several months. Not pleuritic or positional. No significant improvement with nonsteroidals. Recently he has noticed an occasional pain in the left breast area described as a sharp shooting pain. It lasts 30-45 seconds and resolves spontaneously. It is not exertional. It can increase with inspiration. There is no dyspnea on exertion, orthopnea, PND, pedal edema or syncope.  Current Outpatient Prescriptions  Medication Sig Dispense Refill  . celecoxib (CELEBREX) 200 MG capsule Take 1 capsule (200 mg total) by mouth 2 (two) times daily. 60 capsule 1  . lansoprazole (PREVACID) 30 MG capsule Take 1 capsule (30 mg total) by mouth daily at 12 noon. 30 capsule 2   No current facility-administered medications for this visit.    Allergies  Allergen Reactions  . Meningococcal Vaccines Nausea And Vomiting     Past Medical History  Diagnosis Date  . Allergy   . Rhabdomyolysis 06/07/2014    Past Surgical History  Procedure Laterality Date  . No past surgeries      Social History   Social History  . Marital Status: Single    Spouse Name: N/A  . Number of Children: N/A  . Years of Education: N/A   Occupational History  . Not on file.   Social History Main Topics  . Smoking status: Never Smoker   . Smokeless tobacco: Not on file  . Alcohol Use: Yes     Comment: socially  . Drug Use: No  . Sexual Activity: Not on file   Other Topics Concern  . Not on file   Social History Narrative    Family History  Problem Relation Age of Onset  . Hyperlipidemia Mother   . Cancer Father     melanoma  . Cancer - Other Maternal Aunt     breast  . Heart disease Maternal Grandmother     heart attack  . Premature birth Brother     ROS: no fevers or chills, productive cough, hemoptysis, dysphasia, odynophagia,  melena, hematochezia, dysuria, hematuria, rash, seizure activity, orthopnea, PND, pedal edema, claudication. Remaining systems are negative.  Physical Exam:   Blood pressure 116/74, pulse 84, height 5\' 10"  (1.778 m), weight 188 lb 9.6 oz (85.548 kg), SpO2 97 %.  General:  Well developed/well nourished in NAD Skin warm/dry Patient not depressed No peripheral clubbing Back-normal HEENT-normal/normal eyelids Neck supple/normal carotid upstroke bilaterally; no bruits; no JVD; no thyromegaly chest - CTA/ normal expansion CV - RRR/normal S1 and S2; no murmurs, rubs or gallops;  PMI nondisplaced Abdomen -NT/ND, no HSM, no mass, + bowel sounds, no bruit 2+ femoral pulses, no bruits Ext-no edema, chords, 2+ DP Neuro-grossly nonfocal  ECG 04/21/2015-sinus rhythm with no ST changes.

## 2015-05-06 ENCOUNTER — Ambulatory Visit (HOSPITAL_COMMUNITY): Payer: 59 | Attending: Cardiology

## 2015-05-06 ENCOUNTER — Other Ambulatory Visit: Payer: Self-pay

## 2015-05-06 DIAGNOSIS — I371 Nonrheumatic pulmonary valve insufficiency: Secondary | ICD-10-CM | POA: Insufficient documentation

## 2015-05-06 DIAGNOSIS — R079 Chest pain, unspecified: Secondary | ICD-10-CM | POA: Diagnosis not present

## 2015-05-06 DIAGNOSIS — I071 Rheumatic tricuspid insufficiency: Secondary | ICD-10-CM | POA: Insufficient documentation

## 2016-03-02 IMAGING — CR DG CHEST 2V
2 series · 2 of 2 positions shown · non-contrast
Comparison: None.

CLINICAL DATA: Initial encounter for left upper chest pain and
burning for 2 days. Nonsmoker.

EXAM:
CHEST  2 VIEW

[w chest pa]
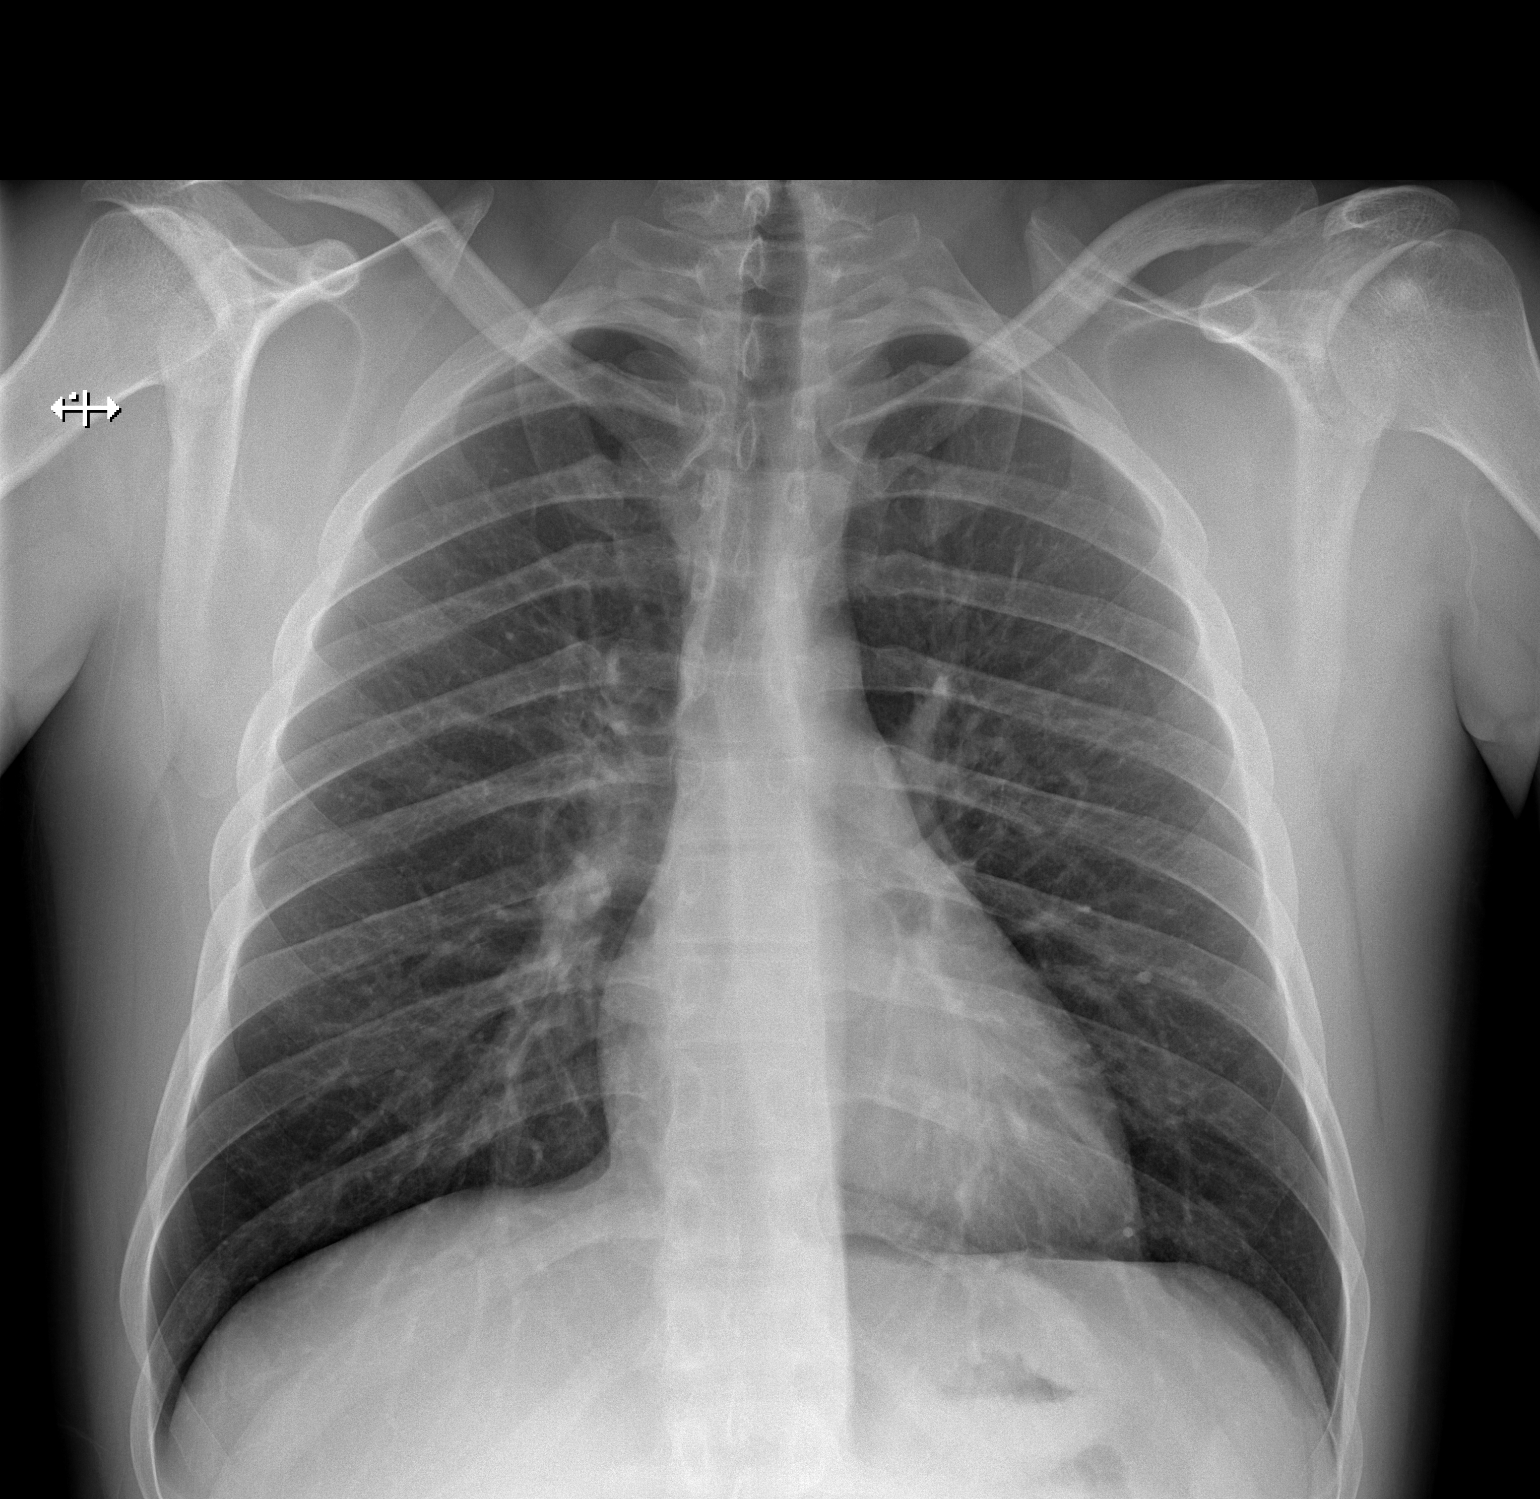

[w chest lat]
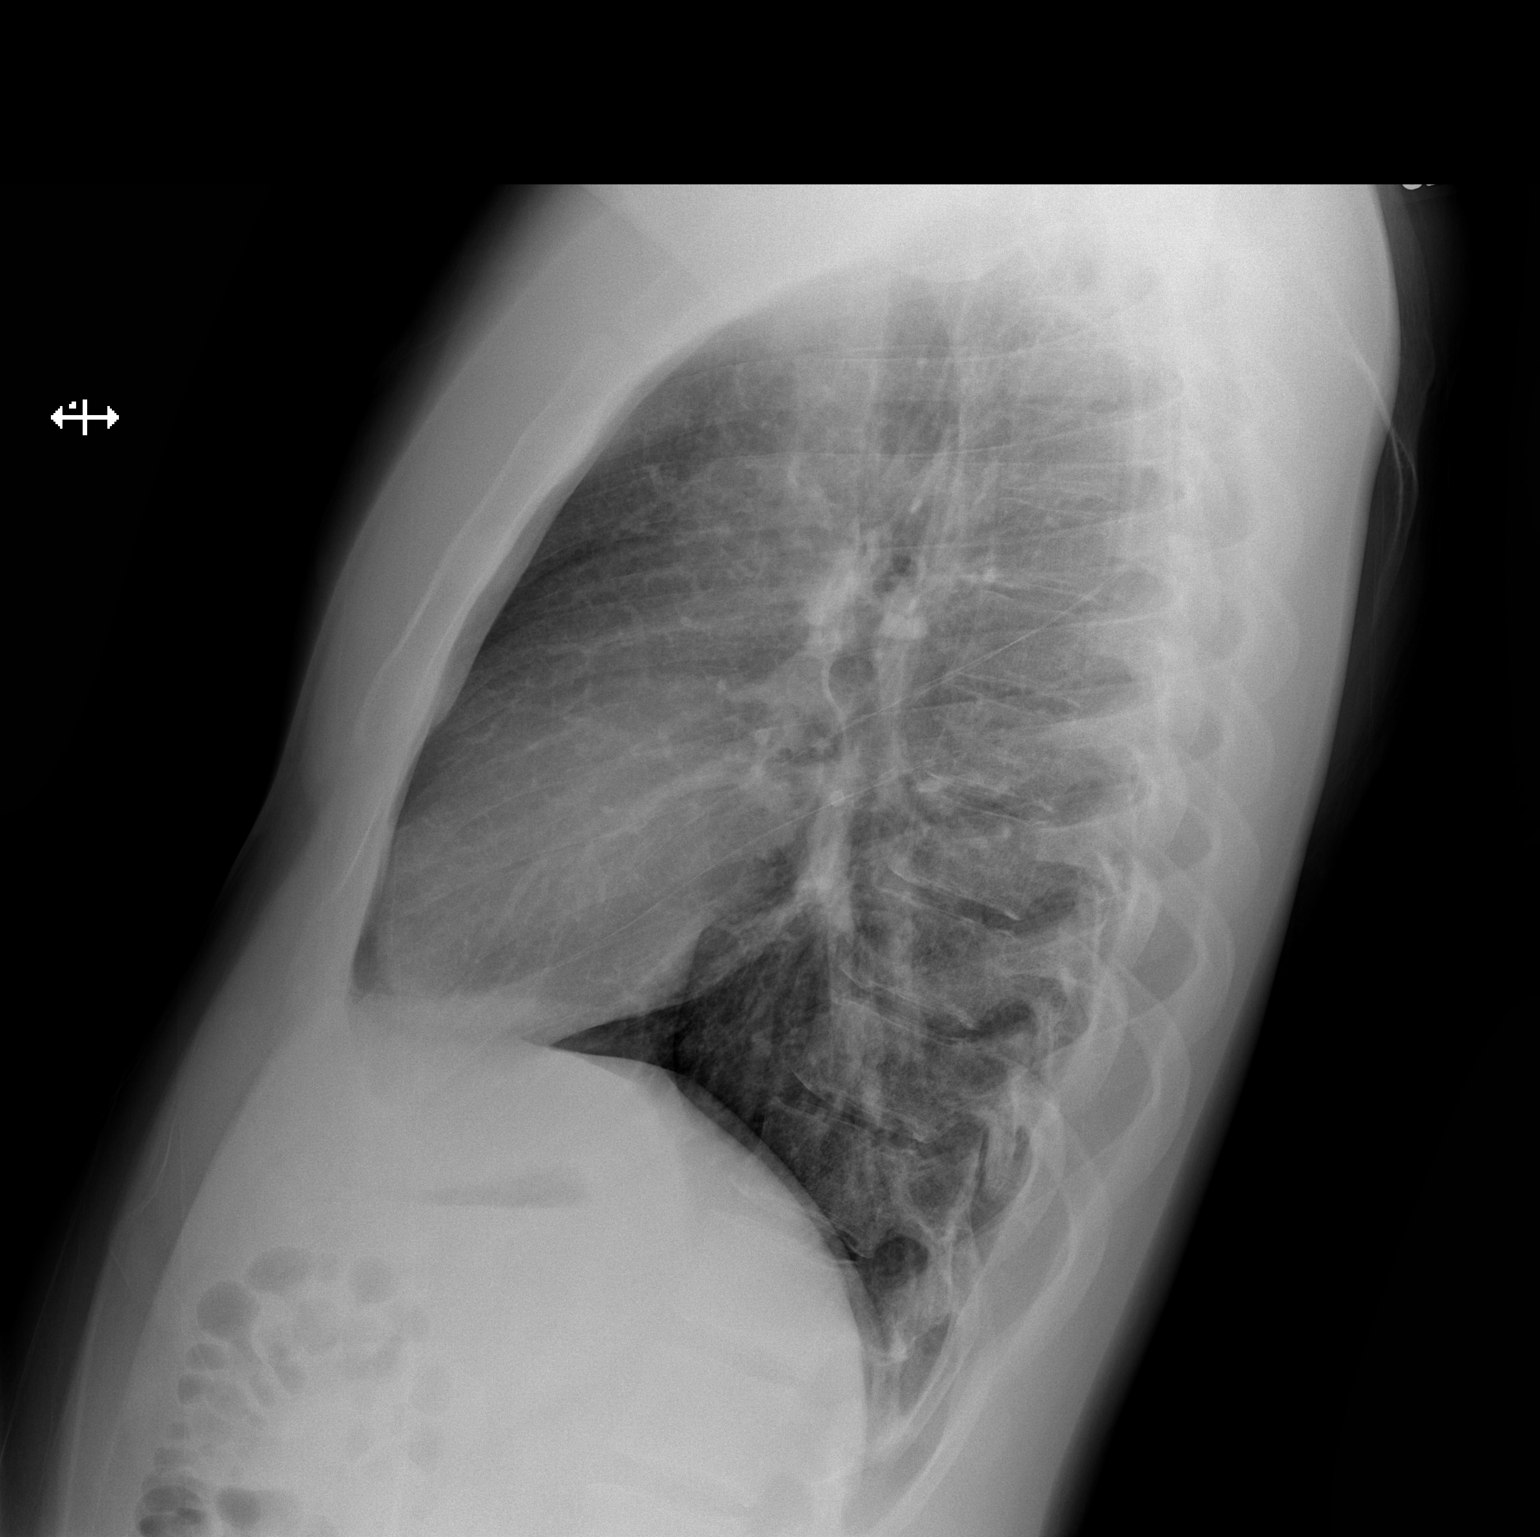

[2 of 2 positions shown; findings below may reference images not displayed]

FINDINGS: Midline trachea.  Normal heart size and mediastinal contours.

Sharp costophrenic angles.  No pneumothorax.  Clear lungs.
IMPRESSION: No active cardiopulmonary disease.
